# Patient Record
Sex: Female | Born: 1940
Health system: Southern US, Community
[De-identification: ages and names within clinical notes are randomized; demographics above are authoritative.]

## PROBLEM LIST (undated history)

## (undated) DIAGNOSIS — K219 Gastro-esophageal reflux disease without esophagitis: Secondary | ICD-10-CM

## (undated) DIAGNOSIS — C801 Malignant (primary) neoplasm, unspecified: Secondary | ICD-10-CM

## (undated) DIAGNOSIS — R002 Palpitations: Secondary | ICD-10-CM

## (undated) HISTORY — DX: Palpitations: R00.2

## (undated) HISTORY — PX: OTHER SURGICAL HISTORY: SHX169

## (undated) HISTORY — DX: Gastro-esophageal reflux disease without esophagitis: K21.9

## (undated) HISTORY — PX: BREAST BIOPSY: SHX20

---

## 1987-02-04 HISTORY — PX: VAGINAL HYSTERECTOMY: SUR661

## 1988-02-04 HISTORY — PX: CYSTECTOMY: SUR359

## 2003-11-28 ENCOUNTER — Ambulatory Visit: Payer: Self-pay | Admitting: Family Medicine

## 2004-11-28 ENCOUNTER — Ambulatory Visit: Payer: Self-pay | Admitting: Family Medicine

## 2005-10-29 ENCOUNTER — Ambulatory Visit: Payer: Self-pay | Admitting: Family Medicine

## 2005-12-02 ENCOUNTER — Ambulatory Visit: Payer: Self-pay | Admitting: Family Medicine

## 2005-12-03 ENCOUNTER — Ambulatory Visit: Payer: Self-pay | Admitting: Gastroenterology

## 2007-01-07 ENCOUNTER — Ambulatory Visit: Payer: Self-pay | Admitting: Family Medicine

## 2008-01-10 ENCOUNTER — Ambulatory Visit: Payer: Self-pay | Admitting: Family Medicine

## 2008-02-14 ENCOUNTER — Ambulatory Visit: Payer: Self-pay | Admitting: Unknown Physician Specialty

## 2009-01-16 ENCOUNTER — Ambulatory Visit: Payer: Self-pay | Admitting: Family Medicine

## 2009-12-20 ENCOUNTER — Ambulatory Visit: Payer: Self-pay | Admitting: Gastroenterology

## 2010-02-27 ENCOUNTER — Ambulatory Visit: Payer: Self-pay | Admitting: Internal Medicine

## 2010-03-11 ENCOUNTER — Ambulatory Visit: Payer: Self-pay | Admitting: Internal Medicine

## 2011-03-05 ENCOUNTER — Ambulatory Visit: Payer: Self-pay | Admitting: Internal Medicine

## 2011-04-07 ENCOUNTER — Ambulatory Visit: Payer: Self-pay | Admitting: Internal Medicine

## 2012-04-23 ENCOUNTER — Ambulatory Visit: Payer: Self-pay | Admitting: Internal Medicine

## 2012-08-28 ENCOUNTER — Other Ambulatory Visit: Payer: Self-pay | Admitting: Family

## 2013-05-04 ENCOUNTER — Ambulatory Visit: Payer: Self-pay | Admitting: Internal Medicine

## 2013-05-17 ENCOUNTER — Other Ambulatory Visit: Payer: Self-pay | Admitting: Otolaryngology

## 2013-05-17 DIAGNOSIS — R200 Anesthesia of skin: Secondary | ICD-10-CM

## 2013-05-26 ENCOUNTER — Ambulatory Visit
Admission: RE | Admit: 2013-05-26 | Discharge: 2013-05-26 | Disposition: A | Discharge status: Home / Self Care | Payer: Medicare HMO | Source: Ambulatory Visit | Attending: Otolaryngology | Admitting: Otolaryngology

## 2013-05-26 DIAGNOSIS — R200 Anesthesia of skin: Secondary | ICD-10-CM

## 2013-05-26 MED ORDER — GADOBENATE DIMEGLUMINE 529 MG/ML IV SOLN: 14.0000 mL | Freq: Once | INTRAVENOUS | Status: AC | PRN

## 2014-04-06 DIAGNOSIS — E782 Mixed hyperlipidemia: Secondary | ICD-10-CM | POA: Insufficient documentation

## 2014-04-24 DIAGNOSIS — M818 Other osteoporosis without current pathological fracture: Secondary | ICD-10-CM | POA: Insufficient documentation

## 2014-05-08 ENCOUNTER — Ambulatory Visit
Admit: 2014-05-08 | Disposition: A | Discharge status: Home / Self Care | Payer: Self-pay | Attending: Internal Medicine | Admitting: Internal Medicine

## 2014-05-11 ENCOUNTER — Ambulatory Visit
Admit: 2014-05-11 | Disposition: A | Discharge status: Home / Self Care | Payer: Self-pay | Attending: Internal Medicine | Admitting: Internal Medicine

## 2015-04-18 DIAGNOSIS — E559 Vitamin D deficiency, unspecified: Secondary | ICD-10-CM | POA: Diagnosis not present

## 2015-04-18 DIAGNOSIS — Z0000 Encounter for general adult medical examination without abnormal findings: Secondary | ICD-10-CM | POA: Diagnosis not present

## 2015-04-18 DIAGNOSIS — E782 Mixed hyperlipidemia: Secondary | ICD-10-CM | POA: Diagnosis not present

## 2015-04-25 DIAGNOSIS — M818 Other osteoporosis without current pathological fracture: Secondary | ICD-10-CM | POA: Diagnosis not present

## 2015-04-25 DIAGNOSIS — E039 Hypothyroidism, unspecified: Secondary | ICD-10-CM

## 2015-04-25 DIAGNOSIS — Z0000 Encounter for general adult medical examination without abnormal findings: Secondary | ICD-10-CM | POA: Diagnosis not present

## 2015-04-26 ENCOUNTER — Other Ambulatory Visit: Payer: Self-pay | Admitting: Internal Medicine

## 2015-04-26 DIAGNOSIS — Z1231 Encounter for screening mammogram for malignant neoplasm of breast: Secondary | ICD-10-CM

## 2015-05-08 DIAGNOSIS — M818 Other osteoporosis without current pathological fracture: Secondary | ICD-10-CM | POA: Diagnosis not present

## 2015-05-14 ENCOUNTER — Other Ambulatory Visit: Payer: Self-pay | Admitting: Internal Medicine

## 2015-05-14 ENCOUNTER — Ambulatory Visit
Admission: RE | Admit: 2015-05-14 | Discharge: 2015-05-14 | Disposition: A | Discharge status: Home / Self Care | Payer: PPO | Source: Ambulatory Visit | Attending: Internal Medicine | Admitting: Internal Medicine

## 2015-05-14 DIAGNOSIS — Z1231 Encounter for screening mammogram for malignant neoplasm of breast: Secondary | ICD-10-CM | POA: Insufficient documentation

## 2015-05-14 HISTORY — DX: Malignant (primary) neoplasm, unspecified: C80.1

## 2015-05-16 ENCOUNTER — Other Ambulatory Visit: Payer: Self-pay | Admitting: Internal Medicine

## 2015-05-16 DIAGNOSIS — R928 Other abnormal and inconclusive findings on diagnostic imaging of breast: Secondary | ICD-10-CM

## 2015-05-18 ENCOUNTER — Ambulatory Visit
Admission: RE | Admit: 2015-05-18 | Discharge: 2015-05-18 | Disposition: A | Discharge status: Home / Self Care | Payer: PPO | Source: Ambulatory Visit | Attending: Internal Medicine | Admitting: Internal Medicine

## 2015-05-18 DIAGNOSIS — R928 Other abnormal and inconclusive findings on diagnostic imaging of breast: Secondary | ICD-10-CM

## 2015-05-18 DIAGNOSIS — N63 Unspecified lump in breast: Secondary | ICD-10-CM | POA: Diagnosis not present

## 2015-05-22 DIAGNOSIS — B9789 Other viral agents as the cause of diseases classified elsewhere: Secondary | ICD-10-CM | POA: Diagnosis not present

## 2015-05-28 DIAGNOSIS — M5414 Radiculopathy, thoracic region: Secondary | ICD-10-CM | POA: Diagnosis not present

## 2015-05-28 DIAGNOSIS — J40 Bronchitis, not specified as acute or chronic: Secondary | ICD-10-CM | POA: Diagnosis not present

## 2015-06-27 DIAGNOSIS — E039 Hypothyroidism, unspecified: Secondary | ICD-10-CM | POA: Diagnosis not present

## 2015-07-11 DIAGNOSIS — H40013 Open angle with borderline findings, low risk, bilateral: Secondary | ICD-10-CM | POA: Diagnosis not present

## 2015-07-11 DIAGNOSIS — H26491 Other secondary cataract, right eye: Secondary | ICD-10-CM | POA: Diagnosis not present

## 2015-07-11 DIAGNOSIS — H31001 Unspecified chorioretinal scars, right eye: Secondary | ICD-10-CM | POA: Diagnosis not present

## 2015-07-11 DIAGNOSIS — H524 Presbyopia: Secondary | ICD-10-CM | POA: Diagnosis not present

## 2016-01-09 DIAGNOSIS — H40013 Open angle with borderline findings, low risk, bilateral: Secondary | ICD-10-CM | POA: Diagnosis not present

## 2016-04-02 DIAGNOSIS — N390 Urinary tract infection, site not specified: Secondary | ICD-10-CM | POA: Diagnosis not present

## 2016-04-25 DIAGNOSIS — E039 Hypothyroidism, unspecified: Secondary | ICD-10-CM | POA: Diagnosis not present

## 2016-04-25 DIAGNOSIS — Z0000 Encounter for general adult medical examination without abnormal findings: Secondary | ICD-10-CM | POA: Diagnosis not present

## 2016-05-07 DIAGNOSIS — Z0000 Encounter for general adult medical examination without abnormal findings: Secondary | ICD-10-CM | POA: Diagnosis not present

## 2016-05-07 DIAGNOSIS — M7050 Other bursitis of knee, unspecified knee: Secondary | ICD-10-CM | POA: Diagnosis not present

## 2016-05-08 ENCOUNTER — Other Ambulatory Visit: Payer: Self-pay | Admitting: Internal Medicine

## 2016-05-08 DIAGNOSIS — Z1231 Encounter for screening mammogram for malignant neoplasm of breast: Secondary | ICD-10-CM

## 2016-05-23 ENCOUNTER — Ambulatory Visit: Payer: PPO

## 2016-05-28 ENCOUNTER — Ambulatory Visit
Admission: RE | Admit: 2016-05-28 | Discharge: 2016-05-28 | Disposition: A | Discharge status: Home / Self Care | Payer: PPO | Source: Ambulatory Visit | Attending: Internal Medicine | Admitting: Internal Medicine

## 2016-05-28 DIAGNOSIS — Z1231 Encounter for screening mammogram for malignant neoplasm of breast: Secondary | ICD-10-CM | POA: Diagnosis not present

## 2016-10-08 DIAGNOSIS — K5792 Diverticulitis of intestine, part unspecified, without perforation or abscess without bleeding: Secondary | ICD-10-CM | POA: Diagnosis not present

## 2017-02-03 DIAGNOSIS — E079 Disorder of thyroid, unspecified: Secondary | ICD-10-CM

## 2017-02-03 HISTORY — DX: Disorder of thyroid, unspecified: E07.9

## 2017-02-04 DIAGNOSIS — H52223 Regular astigmatism, bilateral: Secondary | ICD-10-CM | POA: Diagnosis not present

## 2017-05-06 DIAGNOSIS — Z79899 Other long term (current) drug therapy: Secondary | ICD-10-CM | POA: Diagnosis not present

## 2017-05-06 DIAGNOSIS — E039 Hypothyroidism, unspecified: Secondary | ICD-10-CM | POA: Diagnosis not present

## 2017-05-06 DIAGNOSIS — Z0000 Encounter for general adult medical examination without abnormal findings: Secondary | ICD-10-CM | POA: Diagnosis not present

## 2017-05-13 DIAGNOSIS — Z0000 Encounter for general adult medical examination without abnormal findings: Secondary | ICD-10-CM | POA: Diagnosis not present

## 2017-05-13 DIAGNOSIS — L570 Actinic keratosis: Secondary | ICD-10-CM | POA: Diagnosis not present

## 2017-05-13 DIAGNOSIS — E039 Hypothyroidism, unspecified: Secondary | ICD-10-CM | POA: Diagnosis not present

## 2017-05-13 DIAGNOSIS — Z79899 Other long term (current) drug therapy: Secondary | ICD-10-CM | POA: Diagnosis not present

## 2017-05-13 DIAGNOSIS — E782 Mixed hyperlipidemia: Secondary | ICD-10-CM | POA: Diagnosis not present

## 2017-05-18 ENCOUNTER — Other Ambulatory Visit: Payer: Self-pay | Admitting: Internal Medicine

## 2017-05-18 DIAGNOSIS — Z1231 Encounter for screening mammogram for malignant neoplasm of breast: Secondary | ICD-10-CM

## 2017-06-05 ENCOUNTER — Ambulatory Visit
Admission: RE | Admit: 2017-06-05 | Discharge: 2017-06-05 | Disposition: A | Discharge status: Home / Self Care | Payer: PPO | Source: Ambulatory Visit | Attending: Internal Medicine | Admitting: Internal Medicine

## 2017-06-05 DIAGNOSIS — Z1231 Encounter for screening mammogram for malignant neoplasm of breast: Secondary | ICD-10-CM | POA: Insufficient documentation

## 2018-05-12 DIAGNOSIS — E039 Hypothyroidism, unspecified: Secondary | ICD-10-CM | POA: Diagnosis not present

## 2018-05-12 DIAGNOSIS — E782 Mixed hyperlipidemia: Secondary | ICD-10-CM | POA: Diagnosis not present

## 2018-05-12 DIAGNOSIS — Z79899 Other long term (current) drug therapy: Secondary | ICD-10-CM | POA: Diagnosis not present

## 2018-05-17 DIAGNOSIS — M818 Other osteoporosis without current pathological fracture: Secondary | ICD-10-CM | POA: Diagnosis not present

## 2018-05-17 DIAGNOSIS — L989 Disorder of the skin and subcutaneous tissue, unspecified: Secondary | ICD-10-CM | POA: Diagnosis not present

## 2018-05-17 DIAGNOSIS — E039 Hypothyroidism, unspecified: Secondary | ICD-10-CM | POA: Diagnosis not present

## 2018-05-17 DIAGNOSIS — Z0000 Encounter for general adult medical examination without abnormal findings: Secondary | ICD-10-CM | POA: Diagnosis not present

## 2018-05-17 DIAGNOSIS — E782 Mixed hyperlipidemia: Secondary | ICD-10-CM | POA: Diagnosis not present

## 2018-06-16 ENCOUNTER — Other Ambulatory Visit: Payer: Self-pay | Admitting: Internal Medicine

## 2018-06-16 DIAGNOSIS — Z1231 Encounter for screening mammogram for malignant neoplasm of breast: Secondary | ICD-10-CM

## 2018-07-01 ENCOUNTER — Other Ambulatory Visit: Payer: Self-pay

## 2018-07-01 ENCOUNTER — Ambulatory Visit
Admission: RE | Admit: 2018-07-01 | Discharge: 2018-07-01 | Disposition: A | Discharge status: Home / Self Care | Payer: PPO | Source: Ambulatory Visit | Attending: Internal Medicine | Admitting: Internal Medicine

## 2018-07-01 DIAGNOSIS — Z1231 Encounter for screening mammogram for malignant neoplasm of breast: Secondary | ICD-10-CM | POA: Insufficient documentation

## 2018-08-12 DIAGNOSIS — R05 Cough: Secondary | ICD-10-CM | POA: Diagnosis not present

## 2018-08-12 DIAGNOSIS — K219 Gastro-esophageal reflux disease without esophagitis: Secondary | ICD-10-CM | POA: Diagnosis not present

## 2018-08-12 DIAGNOSIS — J40 Bronchitis, not specified as acute or chronic: Secondary | ICD-10-CM | POA: Diagnosis not present

## 2018-09-02 DIAGNOSIS — R05 Cough: Secondary | ICD-10-CM | POA: Diagnosis not present

## 2018-09-02 DIAGNOSIS — J189 Pneumonia, unspecified organism: Secondary | ICD-10-CM | POA: Diagnosis not present

## 2018-09-02 DIAGNOSIS — R911 Solitary pulmonary nodule: Secondary | ICD-10-CM | POA: Diagnosis not present

## 2018-09-27 DIAGNOSIS — H61032 Chondritis of left external ear: Secondary | ICD-10-CM | POA: Diagnosis not present

## 2018-09-27 DIAGNOSIS — L538 Other specified erythematous conditions: Secondary | ICD-10-CM | POA: Diagnosis not present

## 2018-09-27 DIAGNOSIS — D485 Neoplasm of uncertain behavior of skin: Secondary | ICD-10-CM | POA: Diagnosis not present

## 2018-09-27 DIAGNOSIS — D2261 Melanocytic nevi of right upper limb, including shoulder: Secondary | ICD-10-CM | POA: Diagnosis not present

## 2018-09-27 DIAGNOSIS — D2271 Melanocytic nevi of right lower limb, including hip: Secondary | ICD-10-CM | POA: Diagnosis not present

## 2018-09-27 DIAGNOSIS — L820 Inflamed seborrheic keratosis: Secondary | ICD-10-CM | POA: Diagnosis not present

## 2018-09-27 DIAGNOSIS — D225 Melanocytic nevi of trunk: Secondary | ICD-10-CM | POA: Diagnosis not present

## 2018-09-27 DIAGNOSIS — D2262 Melanocytic nevi of left upper limb, including shoulder: Secondary | ICD-10-CM | POA: Diagnosis not present

## 2018-09-27 DIAGNOSIS — D2272 Melanocytic nevi of left lower limb, including hip: Secondary | ICD-10-CM | POA: Diagnosis not present

## 2018-09-27 DIAGNOSIS — D171 Benign lipomatous neoplasm of skin and subcutaneous tissue of trunk: Secondary | ICD-10-CM | POA: Diagnosis not present

## 2018-10-07 DIAGNOSIS — H401121 Primary open-angle glaucoma, left eye, mild stage: Secondary | ICD-10-CM | POA: Diagnosis not present

## 2018-11-09 DIAGNOSIS — H401121 Primary open-angle glaucoma, left eye, mild stage: Secondary | ICD-10-CM | POA: Diagnosis not present

## 2018-12-06 DIAGNOSIS — R911 Solitary pulmonary nodule: Secondary | ICD-10-CM | POA: Diagnosis not present

## 2019-03-23 DIAGNOSIS — H16143 Punctate keratitis, bilateral: Secondary | ICD-10-CM | POA: Diagnosis not present

## 2019-03-23 DIAGNOSIS — H26491 Other secondary cataract, right eye: Secondary | ICD-10-CM | POA: Diagnosis not present

## 2019-05-16 DIAGNOSIS — E782 Mixed hyperlipidemia: Secondary | ICD-10-CM | POA: Diagnosis not present

## 2019-05-16 DIAGNOSIS — M818 Other osteoporosis without current pathological fracture: Secondary | ICD-10-CM | POA: Diagnosis not present

## 2019-05-16 DIAGNOSIS — E039 Hypothyroidism, unspecified: Secondary | ICD-10-CM | POA: Diagnosis not present

## 2019-05-23 DIAGNOSIS — E039 Hypothyroidism, unspecified: Secondary | ICD-10-CM | POA: Diagnosis not present

## 2019-05-23 DIAGNOSIS — Z0000 Encounter for general adult medical examination without abnormal findings: Secondary | ICD-10-CM | POA: Diagnosis not present

## 2019-05-23 DIAGNOSIS — E782 Mixed hyperlipidemia: Secondary | ICD-10-CM | POA: Diagnosis not present

## 2019-05-24 ENCOUNTER — Other Ambulatory Visit: Payer: Self-pay | Admitting: Internal Medicine

## 2019-05-24 DIAGNOSIS — Z1231 Encounter for screening mammogram for malignant neoplasm of breast: Secondary | ICD-10-CM

## 2019-07-05 ENCOUNTER — Ambulatory Visit
Admission: RE | Admit: 2019-07-05 | Discharge: 2019-07-05 | Disposition: A | Discharge status: Home / Self Care | Payer: PPO | Source: Ambulatory Visit | Attending: Internal Medicine | Admitting: Internal Medicine

## 2019-07-05 DIAGNOSIS — Z1231 Encounter for screening mammogram for malignant neoplasm of breast: Secondary | ICD-10-CM | POA: Insufficient documentation

## 2019-09-05 DIAGNOSIS — H401121 Primary open-angle glaucoma, left eye, mild stage: Secondary | ICD-10-CM | POA: Diagnosis not present

## 2019-11-18 DIAGNOSIS — H40003 Preglaucoma, unspecified, bilateral: Secondary | ICD-10-CM | POA: Diagnosis not present

## 2019-12-08 DIAGNOSIS — H26491 Other secondary cataract, right eye: Secondary | ICD-10-CM | POA: Diagnosis not present

## 2020-01-09 DIAGNOSIS — H40003 Preglaucoma, unspecified, bilateral: Secondary | ICD-10-CM | POA: Diagnosis not present

## 2020-01-16 DIAGNOSIS — H401131 Primary open-angle glaucoma, bilateral, mild stage: Secondary | ICD-10-CM | POA: Diagnosis not present

## 2020-01-23 DIAGNOSIS — R0789 Other chest pain: Secondary | ICD-10-CM | POA: Diagnosis not present

## 2020-01-23 DIAGNOSIS — M5414 Radiculopathy, thoracic region: Secondary | ICD-10-CM | POA: Diagnosis not present

## 2020-03-19 DIAGNOSIS — H401131 Primary open-angle glaucoma, bilateral, mild stage: Secondary | ICD-10-CM | POA: Diagnosis not present

## 2020-05-17 DIAGNOSIS — E782 Mixed hyperlipidemia: Secondary | ICD-10-CM | POA: Diagnosis not present

## 2020-05-25 DIAGNOSIS — R0600 Dyspnea, unspecified: Secondary | ICD-10-CM | POA: Diagnosis not present

## 2020-05-25 DIAGNOSIS — Z0000 Encounter for general adult medical examination without abnormal findings: Secondary | ICD-10-CM | POA: Diagnosis not present

## 2020-05-25 DIAGNOSIS — J439 Emphysema, unspecified: Secondary | ICD-10-CM | POA: Diagnosis not present

## 2020-05-25 DIAGNOSIS — E782 Mixed hyperlipidemia: Secondary | ICD-10-CM | POA: Diagnosis not present

## 2020-05-25 DIAGNOSIS — I208 Other forms of angina pectoris: Secondary | ICD-10-CM | POA: Diagnosis not present

## 2020-05-31 ENCOUNTER — Other Ambulatory Visit: Payer: Self-pay | Admitting: Internal Medicine

## 2020-05-31 DIAGNOSIS — Z1231 Encounter for screening mammogram for malignant neoplasm of breast: Secondary | ICD-10-CM

## 2020-06-05 DIAGNOSIS — R0600 Dyspnea, unspecified: Secondary | ICD-10-CM | POA: Diagnosis not present

## 2020-06-26 DIAGNOSIS — J4530 Mild persistent asthma, uncomplicated: Secondary | ICD-10-CM | POA: Diagnosis not present

## 2020-06-26 DIAGNOSIS — R0600 Dyspnea, unspecified: Secondary | ICD-10-CM | POA: Diagnosis not present

## 2020-07-05 ENCOUNTER — Ambulatory Visit
Admission: RE | Admit: 2020-07-05 | Discharge: 2020-07-05 | Disposition: A | Discharge status: Home / Self Care | Payer: PPO | Source: Ambulatory Visit | Attending: Internal Medicine | Admitting: Internal Medicine

## 2020-07-05 ENCOUNTER — Other Ambulatory Visit: Payer: Self-pay

## 2020-07-05 DIAGNOSIS — Z1231 Encounter for screening mammogram for malignant neoplasm of breast: Secondary | ICD-10-CM | POA: Diagnosis not present

## 2020-09-18 DIAGNOSIS — H401131 Primary open-angle glaucoma, bilateral, mild stage: Secondary | ICD-10-CM | POA: Diagnosis not present

## 2021-01-11 DIAGNOSIS — B0223 Postherpetic polyneuropathy: Secondary | ICD-10-CM | POA: Diagnosis not present

## 2021-01-11 DIAGNOSIS — M5414 Radiculopathy, thoracic region: Secondary | ICD-10-CM | POA: Diagnosis not present

## 2021-02-15 DIAGNOSIS — B0223 Postherpetic polyneuropathy: Secondary | ICD-10-CM | POA: Insufficient documentation

## 2021-02-15 DIAGNOSIS — M5414 Radiculopathy, thoracic region: Secondary | ICD-10-CM | POA: Diagnosis not present

## 2021-03-18 DIAGNOSIS — H401131 Primary open-angle glaucoma, bilateral, mild stage: Secondary | ICD-10-CM | POA: Diagnosis not present

## 2021-03-25 DIAGNOSIS — H401122 Primary open-angle glaucoma, left eye, moderate stage: Secondary | ICD-10-CM | POA: Diagnosis not present

## 2021-04-11 ENCOUNTER — Other Ambulatory Visit: Payer: Self-pay | Admitting: Internal Medicine

## 2021-04-11 ENCOUNTER — Encounter: Payer: Self-pay | Admitting: Emergency Medicine

## 2021-04-11 ENCOUNTER — Other Ambulatory Visit: Payer: Self-pay

## 2021-04-11 ENCOUNTER — Inpatient Hospital Stay
Admission: EM | Admit: 2021-04-11 | Discharge: 2021-04-19 | DRG: 871 | Disposition: A | Discharge status: Home / Self Care | Payer: PPO | Attending: Internal Medicine | Admitting: Internal Medicine

## 2021-04-11 ENCOUNTER — Emergency Department: Payer: PPO

## 2021-04-11 ENCOUNTER — Ambulatory Visit
Admission: RE | Admit: 2021-04-11 | Discharge: 2021-04-11 | Disposition: A | Discharge status: Home / Self Care | Payer: PPO | Source: Ambulatory Visit | Attending: Internal Medicine | Admitting: Internal Medicine

## 2021-04-11 DIAGNOSIS — Z85828 Personal history of other malignant neoplasm of skin: Secondary | ICD-10-CM

## 2021-04-11 DIAGNOSIS — Z4682 Encounter for fitting and adjustment of non-vascular catheter: Secondary | ICD-10-CM | POA: Diagnosis not present

## 2021-04-11 DIAGNOSIS — K5730 Diverticulosis of large intestine without perforation or abscess without bleeding: Secondary | ICD-10-CM | POA: Diagnosis not present

## 2021-04-11 DIAGNOSIS — E872 Acidosis, unspecified: Secondary | ICD-10-CM | POA: Diagnosis present

## 2021-04-11 DIAGNOSIS — E871 Hypo-osmolality and hyponatremia: Secondary | ICD-10-CM | POA: Diagnosis present

## 2021-04-11 DIAGNOSIS — R634 Abnormal weight loss: Secondary | ICD-10-CM | POA: Diagnosis present

## 2021-04-11 DIAGNOSIS — Z20822 Contact with and (suspected) exposure to covid-19: Secondary | ICD-10-CM | POA: Diagnosis present

## 2021-04-11 DIAGNOSIS — D75838 Other thrombocytosis: Secondary | ICD-10-CM | POA: Diagnosis present

## 2021-04-11 DIAGNOSIS — R6520 Severe sepsis without septic shock: Secondary | ICD-10-CM | POA: Diagnosis present

## 2021-04-11 DIAGNOSIS — A419 Sepsis, unspecified organism: Secondary | ICD-10-CM | POA: Diagnosis present

## 2021-04-11 DIAGNOSIS — J90 Pleural effusion, not elsewhere classified: Secondary | ICD-10-CM | POA: Diagnosis not present

## 2021-04-11 DIAGNOSIS — E039 Hypothyroidism, unspecified: Secondary | ICD-10-CM | POA: Diagnosis present

## 2021-04-11 DIAGNOSIS — J154 Pneumonia due to other streptococci: Secondary | ICD-10-CM

## 2021-04-11 DIAGNOSIS — D72829 Elevated white blood cell count, unspecified: Secondary | ICD-10-CM

## 2021-04-11 DIAGNOSIS — I480 Paroxysmal atrial fibrillation: Secondary | ICD-10-CM

## 2021-04-11 DIAGNOSIS — Z79899 Other long term (current) drug therapy: Secondary | ICD-10-CM

## 2021-04-11 DIAGNOSIS — K810 Acute cholecystitis: Secondary | ICD-10-CM

## 2021-04-11 DIAGNOSIS — K219 Gastro-esophageal reflux disease without esophagitis: Secondary | ICD-10-CM | POA: Diagnosis present

## 2021-04-11 DIAGNOSIS — J188 Other pneumonia, unspecified organism: Secondary | ICD-10-CM | POA: Diagnosis not present

## 2021-04-11 DIAGNOSIS — J189 Pneumonia, unspecified organism: Secondary | ICD-10-CM

## 2021-04-11 DIAGNOSIS — I4891 Unspecified atrial fibrillation: Secondary | ICD-10-CM | POA: Diagnosis not present

## 2021-04-11 DIAGNOSIS — J9811 Atelectasis: Secondary | ICD-10-CM | POA: Diagnosis not present

## 2021-04-11 DIAGNOSIS — I5033 Acute on chronic diastolic (congestive) heart failure: Secondary | ICD-10-CM

## 2021-04-11 DIAGNOSIS — Z9889 Other specified postprocedural states: Secondary | ICD-10-CM

## 2021-04-11 DIAGNOSIS — R531 Weakness: Principal | ICD-10-CM

## 2021-04-11 DIAGNOSIS — Z79890 Hormone replacement therapy (postmenopausal): Secondary | ICD-10-CM | POA: Diagnosis not present

## 2021-04-11 DIAGNOSIS — J159 Unspecified bacterial pneumonia: Secondary | ICD-10-CM | POA: Diagnosis not present

## 2021-04-11 DIAGNOSIS — Z6821 Body mass index (BMI) 21.0-21.9, adult: Secondary | ICD-10-CM

## 2021-04-11 DIAGNOSIS — D509 Iron deficiency anemia, unspecified: Secondary | ICD-10-CM

## 2021-04-11 DIAGNOSIS — I959 Hypotension, unspecified: Secondary | ICD-10-CM | POA: Diagnosis present

## 2021-04-11 DIAGNOSIS — R590 Localized enlarged lymph nodes: Secondary | ICD-10-CM | POA: Diagnosis not present

## 2021-04-11 DIAGNOSIS — J869 Pyothorax without fistula: Secondary | ICD-10-CM | POA: Diagnosis present

## 2021-04-11 DIAGNOSIS — D72823 Leukemoid reaction: Secondary | ICD-10-CM | POA: Diagnosis present

## 2021-04-11 DIAGNOSIS — R1011 Right upper quadrant pain: Secondary | ICD-10-CM

## 2021-04-11 DIAGNOSIS — J918 Pleural effusion in other conditions classified elsewhere: Secondary | ICD-10-CM

## 2021-04-11 DIAGNOSIS — Z938 Other artificial opening status: Secondary | ICD-10-CM

## 2021-04-11 DIAGNOSIS — J939 Pneumothorax, unspecified: Secondary | ICD-10-CM | POA: Diagnosis not present

## 2021-04-11 DIAGNOSIS — J9383 Other pneumothorax: Secondary | ICD-10-CM | POA: Diagnosis not present

## 2021-04-11 DIAGNOSIS — I700 Atherosclerosis of aorta: Secondary | ICD-10-CM | POA: Diagnosis not present

## 2021-04-11 DIAGNOSIS — I5021 Acute systolic (congestive) heart failure: Secondary | ICD-10-CM | POA: Diagnosis not present

## 2021-04-11 DIAGNOSIS — J984 Other disorders of lung: Secondary | ICD-10-CM | POA: Diagnosis not present

## 2021-04-11 LAB — PROCALCITONIN: Procalcitonin: 1.8 ng/mL

## 2021-04-11 LAB — URINALYSIS, ROUTINE W REFLEX MICROSCOPIC
Bacteria, UA: NONE SEEN
Bilirubin Urine: NEGATIVE
Glucose, UA: NEGATIVE mg/dL
Ketones, ur: NEGATIVE mg/dL
Nitrite: NEGATIVE
Protein, ur: NEGATIVE mg/dL
Specific Gravity, Urine: 1.033 — ABNORMAL HIGH (ref 1.005–1.030)
Squamous Epithelial / HPF: NONE SEEN (ref 0–5)
pH: 6 (ref 5.0–8.0)

## 2021-04-11 LAB — LACTIC ACID, PLASMA: Lactic Acid, Venous: 2.7 mmol/L (ref 0.5–1.9)

## 2021-04-11 LAB — CBC
HCT: 33.5 % — ABNORMAL LOW (ref 36.0–46.0)
Hemoglobin: 11 g/dL — ABNORMAL LOW (ref 12.0–15.0)
MCH: 28.3 pg (ref 26.0–34.0)
MCHC: 32.8 g/dL (ref 30.0–36.0)
MCV: 86.1 fL (ref 80.0–100.0)
Platelets: 454 10*3/uL — ABNORMAL HIGH (ref 150–400)
RBC: 3.89 MIL/uL (ref 3.87–5.11)
RDW: 13.5 % (ref 11.5–15.5)
WBC: 72.2 10*3/uL (ref 4.0–10.5)
nRBC: 0 % (ref 0.0–0.2)

## 2021-04-11 LAB — CBC WITH DIFFERENTIAL/PLATELET
Abs Immature Granulocytes: 4.69 10*3/uL — ABNORMAL HIGH (ref 0.00–0.07)
Basophils Absolute: 0 10*3/uL (ref 0.0–0.1)
Basophils Relative: 0 %
Eosinophils Absolute: 0.1 10*3/uL (ref 0.0–0.5)
Eosinophils Relative: 0 %
HCT: 33.7 % — ABNORMAL LOW (ref 36.0–46.0)
Hemoglobin: 11.1 g/dL — ABNORMAL LOW (ref 12.0–15.0)
Immature Granulocytes: 7 %
Lymphocytes Relative: 3 %
Lymphs Abs: 2.5 10*3/uL (ref 0.7–4.0)
MCH: 28.5 pg (ref 26.0–34.0)
MCHC: 32.9 g/dL (ref 30.0–36.0)
MCV: 86.6 fL (ref 80.0–100.0)
Monocytes Absolute: 1.9 10*3/uL — ABNORMAL HIGH (ref 0.1–1.0)
Monocytes Relative: 3 %
Neutro Abs: 63.3 10*3/uL — ABNORMAL HIGH (ref 1.7–7.7)
Neutrophils Relative %: 87 %
Platelets: 490 10*3/uL — ABNORMAL HIGH (ref 150–400)
RBC: 3.89 MIL/uL (ref 3.87–5.11)
RDW: 13.4 % (ref 11.5–15.5)
Smear Review: NORMAL
WBC: 72.5 10*3/uL (ref 4.0–10.5)
nRBC: 0 % (ref 0.0–0.2)

## 2021-04-11 LAB — BASIC METABOLIC PANEL
Anion gap: 12 (ref 5–15)
BUN: 19 mg/dL (ref 8–23)
CO2: 24 mmol/L (ref 22–32)
Calcium: 8.3 mg/dL — ABNORMAL LOW (ref 8.9–10.3)
Chloride: 87 mmol/L — ABNORMAL LOW (ref 98–111)
Creatinine, Ser: 0.91 mg/dL (ref 0.44–1.00)
GFR, Estimated: >60 mL/min (ref >60)
Glucose, Bld: 88 mg/dL (ref 70–99)
Potassium: 4 mmol/L (ref 3.5–5.1)
Sodium: 123 mmol/L — ABNORMAL LOW (ref 135–145)

## 2021-04-11 LAB — RESP PANEL BY RT-PCR (FLU A&B, COVID) ARPGX2
Influenza A by PCR: NEGATIVE
Influenza B by PCR: NEGATIVE
SARS Coronavirus 2 by RT PCR: NEGATIVE

## 2021-04-11 LAB — OSMOLALITY, URINE: Osmolality, Ur: 334 mOsm/kg (ref 300–900)

## 2021-04-11 LAB — SODIUM, URINE, RANDOM: Sodium, Ur: <10 mmol/L

## 2021-04-11 LAB — TSH: TSH: 2.955 u[IU]/mL (ref 0.350–4.500)

## 2021-04-11 LAB — POCT I-STAT CREATININE: Creatinine, Ser: 0.9 mg/dL (ref 0.44–1.00)

## 2021-04-11 MED ORDER — VITAMIN B-12 1000 MCG PO TABS
1000.0000 ug | ORAL_TABLET | Freq: Every day | ORAL | Status: DC
  Administered 2021-04-11 – 2021-04-19 (×8): 1000 ug via ORAL
  Filled 2021-04-11 (×10): qty 1

## 2021-04-11 MED ORDER — METRONIDAZOLE 500 MG/100ML IV SOLN
500.0000 mg | Freq: Two times a day (BID) | INTRAVENOUS | Status: DC
  Administered 2021-04-12: 500 mg via INTRAVENOUS
  Filled 2021-04-11 (×2): qty 100

## 2021-04-11 MED ORDER — KETOROLAC TROMETHAMINE 30 MG/ML IJ SOLN
15.0000 mg | Freq: Once | INTRAMUSCULAR | Status: AC
  Administered 2021-04-11: 18:00:00 15 mg via INTRAVENOUS
  Filled 2021-04-11: qty 1

## 2021-04-11 MED ORDER — ENOXAPARIN SODIUM 40 MG/0.4ML IJ SOSY
40.0000 mg | PREFILLED_SYRINGE | INTRAMUSCULAR | Status: DC
  Administered 2021-04-11: 22:00:00 40 mg via SUBCUTANEOUS
  Filled 2021-04-11: qty 0.4

## 2021-04-11 MED ORDER — SODIUM CHLORIDE 0.9 % IV BOLUS
1000.0000 mL | Freq: Once | INTRAVENOUS | Status: AC
  Administered 2021-04-11: 16:00:00 1000 mL via INTRAVENOUS

## 2021-04-11 MED ORDER — VANCOMYCIN HCL 1500 MG/300ML IV SOLN
1500.0000 mg | Freq: Once | INTRAVENOUS | Status: AC
  Administered 2021-04-12: 1500 mg via INTRAVENOUS
  Filled 2021-04-11: qty 300

## 2021-04-11 MED ORDER — DILTIAZEM HCL-DEXTROSE 125-5 MG/125ML-% IV SOLN (PREMIX)
5.0000 mg/h | INTRAVENOUS | Status: DC
  Administered 2021-04-11: 17:00:00 5 mg/h via INTRAVENOUS
  Filled 2021-04-11: qty 125

## 2021-04-11 MED ORDER — LACTATED RINGERS IV SOLN: INTRAVENOUS | Status: DC

## 2021-04-11 MED ORDER — SODIUM CHLORIDE 0.9 % IV SOLN
2.0000 g | Freq: Two times a day (BID) | INTRAVENOUS | Status: DC
  Administered 2021-04-11: 22:00:00 2 g via INTRAVENOUS
  Filled 2021-04-11 (×3): qty 2

## 2021-04-11 MED ORDER — LACTATED RINGERS IV BOLUS (SEPSIS)
1000.0000 mL | Freq: Once | INTRAVENOUS | Status: AC
  Administered 2021-04-11: 1000 mL via INTRAVENOUS

## 2021-04-11 MED ORDER — SODIUM CHLORIDE 0.9 % IV SOLN: INTRAVENOUS | Status: DC

## 2021-04-11 MED ORDER — ACETAMINOPHEN 325 MG PO TABS
650.0000 mg | ORAL_TABLET | Freq: Four times a day (QID) | ORAL | Status: DC | PRN
  Administered 2021-04-18 – 2021-04-19 (×2): 650 mg via ORAL
  Filled 2021-04-11 (×2): qty 2

## 2021-04-11 MED ORDER — PANTOPRAZOLE SODIUM 40 MG PO TBEC
40.0000 mg | DELAYED_RELEASE_TABLET | Freq: Every day | ORAL | Status: DC
  Administered 2021-04-12 – 2021-04-19 (×8): 40 mg via ORAL
  Filled 2021-04-11 (×8): qty 1

## 2021-04-11 MED ORDER — ACETAMINOPHEN 650 MG RE SUPP: 650.0000 mg | Freq: Four times a day (QID) | RECTAL | Status: DC | PRN

## 2021-04-11 MED ORDER — AMIODARONE LOAD VIA INFUSION
150.0000 mg | Freq: Once | INTRAVENOUS | Status: AC
  Administered 2021-04-11: 18:00:00 150 mg via INTRAVENOUS
  Filled 2021-04-11: qty 83.34

## 2021-04-11 MED ORDER — AMIODARONE HCL IN DEXTROSE 360-4.14 MG/200ML-% IV SOLN
60.0000 mg/h | INTRAVENOUS | Status: AC
  Administered 2021-04-11 (×2): 60 mg/h via INTRAVENOUS
  Filled 2021-04-11 (×2): qty 200

## 2021-04-11 MED ORDER — ONDANSETRON HCL 4 MG/2ML IJ SOLN: 4.0000 mg | Freq: Four times a day (QID) | INTRAMUSCULAR | Status: DC | PRN

## 2021-04-11 MED ORDER — ONDANSETRON HCL 4 MG PO TABS: 4.0000 mg | ORAL_TABLET | Freq: Four times a day (QID) | ORAL | Status: DC | PRN

## 2021-04-11 MED ORDER — METOPROLOL TARTRATE 5 MG/5ML IV SOLN
5.0000 mg | Freq: Once | INTRAVENOUS | Status: AC
  Administered 2021-04-11: 16:00:00 5 mg via INTRAVENOUS
  Filled 2021-04-11: qty 5

## 2021-04-11 MED ORDER — VANCOMYCIN HCL 750 MG/150ML IV SOLN
750.0000 mg | INTRAVENOUS | Status: DC
  Filled 2021-04-11 (×2): qty 150

## 2021-04-11 MED ORDER — LATANOPROST 0.005 % OP SOLN
1.0000 [drp] | Freq: Every day | OPHTHALMIC | Status: DC
  Administered 2021-04-11 – 2021-04-18 (×8): 1 [drp] via OPHTHALMIC
  Filled 2021-04-11 (×2): qty 2.5

## 2021-04-11 MED ORDER — ADULT MULTIVITAMIN W/MINERALS CH
1.0000 | ORAL_TABLET | Freq: Every day | ORAL | Status: DC
  Administered 2021-04-11 – 2021-04-19 (×8): 1 via ORAL
  Filled 2021-04-11 (×9): qty 1

## 2021-04-11 MED ORDER — LEVOTHYROXINE SODIUM 50 MCG PO TABS
75.0000 ug | ORAL_TABLET | Freq: Every day | ORAL | Status: DC
  Administered 2021-04-12 – 2021-04-19 (×8): 75 ug via ORAL
  Filled 2021-04-11 (×2): qty 1
  Filled 2021-04-11: qty 2
  Filled 2021-04-11: qty 1
  Filled 2021-04-11: qty 2
  Filled 2021-04-11 (×4): qty 1

## 2021-04-11 MED ORDER — AMIODARONE HCL IN DEXTROSE 360-4.14 MG/200ML-% IV SOLN
30.0000 mg/h | INTRAVENOUS | Status: DC
  Administered 2021-04-12: 30 mg/h via INTRAVENOUS
  Filled 2021-04-11: qty 200

## 2021-04-11 MED ORDER — IOHEXOL 300 MG/ML  SOLN
100.0000 mL | Freq: Once | INTRAMUSCULAR | Status: AC | PRN
  Administered 2021-04-11: 13:00:00 100 mL via INTRAVENOUS

## 2021-04-11 NOTE — Assessment & Plan Note (Signed)
-   With elevated immature granulocytes ?- Pathology to review smear has been ordered ?- Hematologist/oncologist, Dr. Grayland Ormond has been consulted and will see the patient ?- CBC in the a.m. ?

## 2021-04-11 NOTE — ED Notes (Signed)
MD Bradler at bedside speaking with pt at this time.  ?

## 2021-04-11 NOTE — Assessment & Plan Note (Addendum)
-   Continue diltiazem gtt. ?- Check TSH ?- IR consulted for thoracentesis ?- Complete echo ordered ?- [facility] cardiology, Dr. Curt Bears has been consulted by EDP, and he states he will see Katherine Bradshaw regarding atrial fibrillation ?

## 2021-04-11 NOTE — H&P (Addendum)
Addendum: Stat lactic acid, procalcitonin ordered on admission resulted.  Lactic acid was elevated 2.7, procalcitonin was elevated at 1.80  # Patient met severe sepsis criteria with increased heart rate, leukocytosis, source of loculated pneumonia, organ involvement is heart with new onset A-fib - Discussed with RN to hang LR 1 L bolus, patient with then received 2 L which is appropriate for her sepsis bolus - LR 125 mL/h ordered - Discussed with nursing staff to start additional IV if needed - Blood cultures x2 ordered - Continue broad-spectrum antibiotic as above   History and Physical   ITZIA CUNLIFFE PIR:[id number] DOB: Jun 19, 1940 DOA: 04/11/2021  PCP: Rusty Aus, MD  [facility] from: [facility]  I have personally briefly reviewed patient's old medical records in [place].  Chief Concern: Weakness  HPI: [name] is a 81 year old female with medical history of hypothyroid, GERD, who presents to the emergency department from [facility] clinic for chief concerns of shortness of breath, right lower quadrant pain, weakness.  Initial vitals in the emergency department showed temperature of 97.8, respiration rate of 20, heart rate of 102, initial blood pressure 130/96 and decreased to 102/42, SPO2 of 98% on room air, serum sodium 123, potassium 4.0, chloride 87, bicarb 24, BUN of 19, serum creatinine of 0.91, GFR greater than 60, nonfasting blood glucose 88, WBC 72.2, hemoglobin 11.1, platelets of 454.  CT abdomen pelvis with contrast at [facility] clinic was read as: Partially imaged loculated appearing small pleural effusion.  No acute abnormality in the abdomen or pelvis.  ED provider ordered portable chest x-ray and is currently pending.  Patient was found to have new onset atrial fibrillation with RVR.  ED treatment: Metoprolol 5 mg IV, amiodarone 150 mg IV, gtt., diltiazem gtt., ketorolac 15 mg IV one-time dose,   Patient did not respond to amiodarone gtt.  and was initiated on diltiazem gtt.  Patient also received sodium chloride 1 L bolus.  EDP also consulted Dr. Grayland Ormond, and Dr. Curt Bears with [facility] cardiology. ----------- At bedside she was able to tell me her name, age, current calendar year.  She reports that she experienced upper respiratory symptoms about 1 week ago. She had cough, that was productive of white phlegm. She denies fever, chills, known sick contacts.  She denies dysuria, diarrhea, syncope.  She had sharp intermittent right lower quadrant abdominal pain, that started 4 days, while she was sitting on the couch watching TV, 10/10, lasting seconds. It was initially constant. She denies trauma to her person.   She endorses an unintentional three pound weight loss in the last 4 days.  Social history: She lives by herself. She denies tobacco, etoh, recreational drug use. She is retired and formerly worked for an [employer].   Family history: sister had breast cancer   Vaccination history: She is vaccinated for covid 19 and influenza  ROS: Constitutional: no weight change, no fever ENT/Mouth: no sore throat, no rhinorrhea Eyes: no eye pain, no vision changes Cardiovascular: no chest pain, no dyspnea,  no edema, no palpitations Respiratory: no cough, no sputum, no wheezing Gastrointestinal: no nausea, no vomiting, no diarrhea, no constipation Genitourinary: no urinary incontinence, no dysuria, no hematuria Musculoskeletal: no arthralgias, no myalgias Skin: no skin lesions, no pruritus, Neuro: + weakness, no loss of consciousness, no syncope Psych: no anxiety, no depression, + decrease appetite Heme/Lymph: no bruising, no bleeding  ED Course: With emergency medicine provider, patient requiring hospitalization for chief concerns of new onset atrial  fibrillation.  Assessment/Plan  Principal Problem:   New onset atrial fibrillation (HCC) Active Problems:   Pleural effusion on right   Hyponatremia   Loculated  pleural effusion   Leukocytosis   Hypothyroidism   Sepsis due to pneumonia [facility])   Cardiovascular and Mediastinum * New onset atrial fibrillation (HCC) Assessment & Plan - Continue diltiazem gtt. - Check TSH - IR consulted for thoracentesis - Complete echo ordered [facility] cardiology, Dr. Curt Bears has been consulted by EDP, and he states he will see [name]  Respiratory Loculated pleural effusion Assessment & Plan - IR has been consulted for thoracentesis of right pleural effusion, labs ordered including flow cytometry, body fluid stain and culture, lactic dehydrogenase - Check MRSA PCR - Broad-spectrum antibiotic with cefepime per pharmacy, vancomycin per pharmacy, and metronidazole 500 g every 12 hours - Check lactic acid stat, disscused with RN the need for stat labs - 1 L bolus of LR ordered - PT, PTT, INR in the a.m. ordered  Pleural effusion on right Assessment & Plan - IR as above  Other Leukocytosis Assessment & Plan - With elevated immature granulocytes - Pathology to review smear has been ordered - Hematologist/oncologist, Dr. Grayland Ormond has been consulted and will see the patient - CBC in the a.m.  Hyponatremia Assessment & Plan - Etiology work-up in progress at this time - Moderate asymptomatic - Serum osmolality, urine osmolality, urine sodium ordered - Status post sodium chloride 1 L bolus per EDP - BMP in the a.m.  Hypothyroid-resumed home levothyroxine 75 mcg in the morning GERD-PPI  Chart reviewed.   DVT prophylaxis: Enoxaparin Code Status: Full code Diet: Heart healthy Family Communication: Updated son and son's girlfriend with patient's permission Disposition Plan: Pending clinical course Consults called: Cardiology, hematology/oncology Admission status: Progressive cardiac, observation  Past Medical History:  Diagnosis Date   Cancer ([place])    skin   Past Surgical History:  Procedure Laterality Date   BREAST  BIOPSY Bilateral    neg   Social History:  reports that she has never smoked. She has never used smokeless tobacco. She reports that she does not currently use alcohol. She reports that she does not use drugs.  No Known Allergies Family History  Problem Relation Age of Onset   Breast cancer Sister 92   Family history: Family history reviewed and not pertinent.  Prior to Admission medications   Medication Sig Start Date End Date Taking? Authorizing Provider  latanoprost (XALATAN) 0.005 % ophthalmic solution Place 1 drop into both eyes at bedtime.   Yes [provider]  levothyroxine (SYNTHROID) 75 MCG tablet Take 75 mcg by mouth every morning.   Yes [provider]  Multiple Vitamins-Minerals (MULTIVITAMIN WITH MINERALS) tablet Take 1 tablet by mouth daily.   Yes [provider]  pantoprazole (PROTONIX) 40 MG tablet Take 40 mg by mouth daily.   Yes [provider]  vitamin B-12 (CYANOCOBALAMIN) 1000 MCG tablet Take 1,000 mcg by mouth daily.   Yes [provider]   Physical Exam: Vitals:   04/11/21 1745 04/11/21 1757 04/11/21 2000 04/11/21 2230  BP: (!) 76/68 (!) 88/69 (!) 115/57 116/60  Pulse:  (!) 120 (!) 129 (!) 143  Resp:  20 (!) 26 (!) 31  Temp:      TempSrc:      SpO2:  95% 95% 93%  Weight:      Height:       Constitutional: appears age-appropriate, NAD, calm, comfortable Eyes: PERRL, lids  and conjunctivae normal ENMT: Mucous membranes are moist. Posterior pharynx clear of any exudate or lesions. Age-appropriate dentition. Hearing appropriate Neck: normal, supple, no masses, no thyromegaly Respiratory: clear to auscultation bilaterally, no wheezing, no crackles. Normal respiratory effort. No accessory muscle use.  Cardiovascular: Regular rate and rhythm, no murmurs / rubs / gallops. No extremity edema. 2+ pedal pulses. No carotid bruits.  Abdomen: no tenderness, no masses palpated, no hepatosplenomegaly. Bowel sounds positive.   Musculoskeletal: no clubbing / cyanosis. No joint deformity upper and lower extremities. Good ROM, no contractures, no atrophy. Normal muscle tone.  Skin: no rashes, lesions, ulcers. No induration Neurologic: Sensation intact. Strength 5/5 in all 4.  Psychiatric: Normal judgment and insight. Alert and oriented x 3. Normal mood.   EKG: independently reviewed last ED EKG showing atrial fibrillation with rate of 118, QTc 483.  Chest x-ray on Admission: I personally reviewed and I agreewith radiologist reading as below.  CT ABDOMEN PELVIS W CONTRAST  Result Date: 04/11/2021 CLINICAL DATA:  Right-sided abdominal pain EXAM: CT ABDOMEN AND PELVIS WITH CONTRAST TECHNIQUE: Multidetector CT imaging of the abdomen and pelvis was performed using the standard protocol following bolus administration of intravenous contrast. RADIATION DOSE REDUCTION: This exam was performed according to the departmental dose-optimization program which includes automated exposure control, adjustment of the mA and/or kV according to patient size and/or use of iterative reconstruction technique. CONTRAST:  113m OMNIPAQUE IOHEXOL 300 MG/ML  SOLN COMPARISON:  None. FINDINGS: Lower chest: Loculated appearing small right pleural effusion is partially imaged. There is adjacent atelectasis. Hepatobiliary: No focal liver abnormality is seen. No gallstones, gallbladder wall thickening, or biliary dilatation. Pancreas: Unremarkable. No pancreatic ductal dilatation or surrounding inflammatory changes. Spleen: Normal in size without focal abnormality. Adrenals/Urinary Tract: Adrenals are unremarkable. Too small to characterize low-density lesions of the right kidney. Bladder is unremarkable. Stomach/Bowel: Stomach is within normal limits. Bowel is normal in caliber. Sigmoid diverticulosis. Vascular/Lymphatic: Atherosclerosis.  No enlarged nodes. Reproductive: Status post hysterectomy. No adnexal masses. Other: No free fluid.  No acute abnormality  of the abdominal wall. Musculoskeletal: No acute osseous abnormality. IMPRESSION: Partially imaged loculated appearing small right pleural effusion. No acute abnormality in the abdomen or pelvis. Electronically Signed   By: PMacy MisM.D.   On: 04/11/2021 13:05   DG Chest Port 1 View  Result Date: 04/11/2021 CLINICAL DATA:  Evaluate pleural effusion seen on CT. EXAM: PORTABLE CHEST 1 VIEW COMPARISON:  None. FINDINGS: Mild to moderate severity diffuse, chronic appearing increased interstitial lung markings are seen. Mild areas of atelectasis are noted within the bilateral lung bases. There are very small bilateral pleural effusions. No pneumothorax is identified. The heart size and mediastinal contours are within normal limits. There is marked severity calcification of the aortic arch. The visualized skeletal structures are unremarkable. IMPRESSION: 1. Chronic appearing increased interstitial lung markings. Mild, superimposed component of interstitial edema cannot be excluded. 2. Mild bibasilar atelectasis. 3. Very small bilateral pleural effusions. Electronically Signed   By: TVirgina NorfolkM.D.   On: 04/11/2021 18:59    Labs on Admission: I have personally reviewed following labs  CBC: Recent Labs  Lab 04/11/21 1505  WBC 72.5*   72.2*  NEUTROABS 63.3*  HGB 11.1*   11.0*  HCT 33.7*   33.5*  MCV 86.6   86.1  PLT 490*   4672   Basic Metabolic Panel: Recent Labs  Lab 04/11/21 1252 04/11/21 1505  NA  --  123*  K  --  4.0  CL  --  87*  CO2  --  24  GLUCOSE  --  88  BUN  --  19  CREATININE 0.90 0.91  CALCIUM  --  8.3*   GFR: Estimated Creatinine Clearance: 42.9 mL/min (by C-G formula based on SCr of 0.91 mg/dL).  CRITICAL CARE Performed by: Criss Katherine  Total critical care time: 35 minutes  Critical care time was exclusive of separately billable procedures and treating other patients.  Critical care was necessary to treat or prevent imminent or life-threatening  deterioration.  Critical care was time spent personally by me on the following activities: development of treatment plan with patient and/or surrogate as well as nursing, discussions with consultants, evaluation of patient's response to treatment, examination of patient, obtaining history from patient or surrogate, ordering and performing treatments and interventions, ordering and review of laboratory studies, ordering and review of radiographic studies, pulse oximetry and re-evaluation of patient's condition.  Dr. Tobie Poet [facility]  If 7PM-7AM, please contact overnight-coverage provider If 7AM-7PM, please contact day coverage provider [email or web address]  04/11/2021, 11:36 PM

## 2021-04-11 NOTE — ED Provider Notes (Signed)
[facility] Provider Note   Event Date/Time   First MD Initiated Contact with Patient 04/11/21 1551     (approximate) History  Weakness  HPI Katherine Bradshaw is a 81 y.o. female with no stated past medical history presents for [facility] clinic after she presented for weakness, shortness of breath, and right lower quadrant abdominal pain that has been intermittent over the last 2 days.  Patient states that she had a CT done today that was normal but showed a possible right pleural effusion.  Patient also had evidence of hyponatremia to 128 as well as significant cytosis associated with 78.5k.  Patient describes this right lower quadrant abdominal pain as sharp, radiating from the inside out, and coming intermittently before waning spontaneously.  Patient denies any chest pain, palpitations, nausea/vomiting/diarrhea, or weakness/numbness/paresthesias in any extremity Physical Exam  Triage Vital Signs: ED Triage Vitals  Enc Vitals Group     BP 04/11/21 1457 (!) 130/96     Pulse Rate 04/11/21 1457 (!) 102     Resp 04/11/21 1457 20     Temp 04/11/21 1457 97.8 F (36.6 C)     Temp Source 04/11/21 1457 Oral     SpO2 04/11/21 1457 98 %     Weight 04/11/21 1454 126 lb (57.2 kg)     Height 04/11/21 1454 '5\' 5"'$  (1.651 m)     Head Circumference --      Peak Flow --      Pain Score 04/11/21 1454 10     Pain Loc --      Pain Edu? --      Excl. in [place]? --    Most recent vital signs: Vitals:   04/11/21 1745 04/11/21 1757  BP: (!) 76/68 (!) 88/69  Pulse:  (!) 120  Resp:  20  Temp:    SpO2:  95%   General: Awake, oriented x4. CV:  Good peripheral perfusion.  Resp:  Normal effort.  Irregularly irregular tachycardic pulse Abd:  No distention.  Other:  Elderly Caucasian female laying in bed in no distress ED Results / Procedures / Treatments  Labs (all labs ordered are listed, but only abnormal results are displayed) Labs Reviewed  BASIC METABOLIC PANEL - Abnormal;  Notable for the following components:      Result Value   Sodium 123 (*)    Chloride 87 (*)    Calcium 8.3 (*)    All other components within normal limits  CBC - Abnormal; Notable for the following components:   WBC 72.2 (*)    Hemoglobin 11.0 (*)    HCT 33.5 (*)    Platelets 454 (*)    All other components within normal limits  CBC WITH DIFFERENTIAL/PLATELET - Abnormal; Notable for the following components:   WBC 72.5 (*)    Hemoglobin 11.1 (*)    HCT 33.7 (*)    Platelets 490 (*)    Neutro Abs 63.3 (*)    Monocytes Absolute 1.9 (*)    Abs Immature Granulocytes 4.69 (*)    All other components within normal limits  RESP PANEL BY RT-PCR (FLU A&B, COVID) ARPGX2  URINALYSIS, ROUTINE W REFLEX MICROSCOPIC  PATHOLOGIST SMEAR REVIEW  BASIC METABOLIC PANEL  MAGNESIUM  PHOSPHORUS  CBC WITH DIFFERENTIAL/PLATELET  PROTIME-INR  CORTISOL-AM, BLOOD  PROCALCITONIN  TSH  LACTIC ACID, PLASMA  LACTIC ACID, PLASMA  CBG MONITORING, ED   EKG ED ECG REPORT I, Naaman Plummer, the attending physician, personally viewed and interpreted this  ECG. Date: 04/11/2021 EKG Time: 1753 Rate: 118 Rhythm: Atrial fibrillation with rapid ventricular response QRS Axis: normal Intervals: normal ST/T Wave abnormalities: normal Narrative Interpretation: Atrial fibrillation with rapid ventricular response.  No evidence of acute ischemia RADIOLOGY ED MD interpretation: CT of the abdomen and pelvis with IV contrast interpreted by me to show evidence of partially loculated appearing small right pleural effusion with no other acute abnormalities in the abdomen or pelvis -Agree with radiology assessment Official radiology report(s): CT ABDOMEN PELVIS W CONTRAST  Result Date: 04/11/2021 CLINICAL DATA:  Right-sided abdominal pain EXAM: CT ABDOMEN AND PELVIS WITH CONTRAST TECHNIQUE: Multidetector CT imaging of the abdomen and pelvis was performed using the standard protocol following bolus administration of  intravenous contrast. RADIATION DOSE REDUCTION: This exam was performed according to the departmental dose-optimization program which includes automated exposure control, adjustment of the mA and/or kV according to patient size and/or use of iterative reconstruction technique. CONTRAST:  140m OMNIPAQUE IOHEXOL 300 MG/ML  SOLN COMPARISON:  None. FINDINGS: Lower chest: Loculated appearing small right pleural effusion is partially imaged. There is adjacent atelectasis. Hepatobiliary: No focal liver abnormality is seen. No gallstones, gallbladder wall thickening, or biliary dilatation. Pancreas: Unremarkable. No pancreatic ductal dilatation or surrounding inflammatory changes. Spleen: Normal in size without focal abnormality. Adrenals/Urinary Tract: Adrenals are unremarkable. Too small to characterize low-density lesions of the right kidney. Bladder is unremarkable. Stomach/Bowel: Stomach is within normal limits. Bowel is normal in caliber. Sigmoid diverticulosis. Vascular/Lymphatic: Atherosclerosis.  No enlarged nodes. Reproductive: Status post hysterectomy. No adnexal masses. Other: No free fluid.  No acute abnormality of the abdominal wall. Musculoskeletal: No acute osseous abnormality. IMPRESSION: Partially imaged loculated appearing small right pleural effusion. No acute abnormality in the abdomen or pelvis. Electronically Signed   By: PMacy MisM.D.   On: 04/11/2021 13:05   PROCEDURES: Critical Care performed: Yes, see critical care procedure note(s) CRITICAL CARE Performed by: ENaaman Plummer Total critical care time: 41 minutes  Critical care time was exclusive of separately billable procedures and treating other patients.  Critical care was necessary to treat or prevent imminent or life-threatening deterioration.  Critical care was time spent personally by me on the following activities: development of treatment plan with patient and/or surrogate as well as nursing, discussions with  consultants, evaluation of patient's response to treatment, examination of patient, obtaining history from patient or surrogate, ordering and performing treatments and interventions, ordering and review of laboratory studies, ordering and review of radiographic studies, pulse oximetry and re-evaluation of patient's condition.  Procedures MEDICATIONS ORDERED IN ED: Medications  diltiazem (CARDIZEM) 125 mg in dextrose 5% 125 mL (1 mg/mL) infusion (0 mg/hr Intravenous Stopped 04/11/21 1735)  amiodarone (NEXTERONE PREMIX) 360-4.14 MG/200ML-% (1.8 mg/mL) IV infusion (60 mg/hr Intravenous New Bag/Given 04/11/21 1739)    Followed by  amiodarone (NEXTERONE PREMIX) 360-4.14 MG/200ML-% (1.8 mg/mL) IV infusion (has no administration in time range)  0.9 %  sodium chloride infusion ( Intravenous New Bag/Given 04/11/21 1738)  acetaminophen (TYLENOL) tablet 650 mg (has no administration in time range)    Or  acetaminophen (TYLENOL) suppository 650 mg (has no administration in time range)  ondansetron (ZOFRAN) tablet 4 mg (has no administration in time range)    Or  ondansetron (ZOFRAN) injection 4 mg (has no administration in time range)  enoxaparin (LOVENOX) injection 40 mg (has no administration in time range)  lactated ringers bolus 1,000 mL (has no administration in time range)  metroNIDAZOLE (FLAGYL) IVPB 500 mg (has no administration  in time range)  sodium chloride 0.9 % bolus 1,000 mL (0 mLs Intravenous Stopped 04/11/21 1743)  metoprolol tartrate (LOPRESSOR) injection 5 mg (5 mg Intravenous Given 04/11/21 1627)  amiodarone (NEXTERONE) 1.8 mg/mL load via infusion 150 mg (150 mg Intravenous Bolus from Bag 04/11/21 1740)  ketorolac (TORADOL) 30 MG/ML injection 15 mg (15 mg Intravenous Given 04/11/21 1740)   IMPRESSION / MDM / ASSESSMENT AND PLAN / ED COURSE  I reviewed the triage vital signs and the nursing notes.                             The patient is on the cardiac monitor to evaluate for evidence of  arrhythmia and/or significant heart rate changes. + atrial fibrillation w/ RVR DDx: Pneumothorax, Pneumonia, Pulmonary Embolus, Tamponade, ACS, Thyrotoxicosis.  No history or evidence decompensated heart failure. Given their history and exam it is likely this patient is unlikely to spontaneously revert to a rate controlled rhythm and necessitates a thorough workup for their arrhythmia. Workup: ECG, CXR, CBC, BMP, UA, Troponin, BNP, TSH, Ca-Mag-Phos Patient incidentally found to have significant leukocytosis(72.5) with predominantly immature granulocytes on differential concerning for possible malignancy. Interventions: Defer Cardioversion (uncertain historical reliability with time of onset, increased risk of thromboembolic stroke).  Start diltiazem bolus and drip.  Patient did not tolerate this due to hypotension and was switched to an amiodarone bolus and drip with good response Consults: Cardiology-Dr. Curt Bears agrees with plan and will see patient Oncology-Dr. Arnette Norris reviewing case and will see patient Hospitalist-Dr. Cox agrees to accept this patient onto the hospitalist service for further evaluation and management  Disposition: Admit    FINAL CLINICAL IMPRESSION(S) / ED DIAGNOSES   Final diagnoses:  Generalized weakness  Hyponatremia  Atrial fibrillation with RVR ([place])  Leukocytosis, unspecified type   Rx / DC Orders   ED Discharge Orders     None      Note:  This document was prepared using Dragon voice recognition software and may include unintentional dictation errors.   Naaman Plummer, MD 04/11/21 [id number]

## 2021-04-11 NOTE — Assessment & Plan Note (Signed)
-   IR as above ?

## 2021-04-11 NOTE — Assessment & Plan Note (Addendum)
-   IR has been consulted for thoracentesis of right pleural effusion, labs ordered including flow cytometry, body fluid stain and culture, lactic dehydrogenase ?- Check MRSA PCR ?- Broad-spectrum antibiotic with cefepime per pharmacy, vancomycin per pharmacy, and metronidazole 500 g every 12 hours ?- Check lactic acid stat ?- 1 L bolus of LR ordered ?

## 2021-04-11 NOTE — Assessment & Plan Note (Signed)
-   Administer additional fluid bolus to complete 30 mill per kilogram ?

## 2021-04-11 NOTE — Hospital Course (Addendum)
[name] is a 81 year old female with medical history of hypothyroid, GERD, who presents to the emergency department from [facility] clinic for chief concerns of shortness of breath, right lower quadrant pain, weakness. ? ?Initial vitals in the emergency department showed temperature of 97.8, respiration rate of 20, heart rate of 102, initial blood pressure 130/96 and decreased to 102/42, SPO2 of 98% on room air, serum sodium 123, potassium 4.0, chloride 87, bicarb 24, BUN of 19, serum creatinine of 0.91, GFR greater than 60, nonfasting blood glucose 88, WBC 72.2, hemoglobin 11.1, platelets of 454. ? ?CT abdomen pelvis with contrast at [facility] clinic was read as: Partially imaged loculated appearing small pleural effusion.  No acute abnormality in the abdomen or pelvis. ? ?ED provider ordered portable chest x-ray and is currently pending. ? ?Patient was found to have new onset atrial fibrillation with RVR. ? ?ED treatment: Metoprolol 5 mg IV, amiodarone 150 mg IV, gtt., diltiazem gtt., ketorolac 15 mg IV one-time dose,  ? ?Patient did not respond to amiodarone gtt. and was initiated on diltiazem gtt. ? ?Patient also received sodium chloride 1 L bolus. ? ?EDP also consulted Dr. Grayland Ormond, and Dr. Curt Bears with [facility] cardiology. ?

## 2021-04-11 NOTE — Assessment & Plan Note (Signed)
-   Etiology work-up in progress at this time ?- Moderate asymptomatic ?- Serum osmolality, urine osmolality, urine sodium ordered ?- Status post sodium chloride 1 L bolus per EDP ?- BMP in the a.m. ?

## 2021-04-11 NOTE — ED Triage Notes (Signed)
Pt via POV from [facility]. Pt was sent over to increased weakness, SOB, and RLQ pain that is now intermittent. Denies urinary symptoms. Denies fevers. Denies NVD. CT was done today and it was normal but is showed a possible R pleural effusion. Blood work from [facility] showed Na+ 128 and WBC of 78.5. Pt is A&Ox4 and NAD.  ?

## 2021-04-11 NOTE — Consult Note (Signed)
Pharmacy Antibiotic Note ? ?Katherine Bradshaw is a 81 y.o. female admitted on 04/11/2021 with sepsis.  Pharmacy has been consulted for cefepime and vancomycin dosing.  ? ?Plan: ?Will start cefepime 2 g q12H  ? ?Will give vancomycin 1500 units loading dose followed by 750 mg q24H. Predicted AUC of 455. Goal AUC of 400-550. Vd 0.72, used IBW, Scr 0.91. Plan to obtain vancomycin levels after 4th or 5th dose.  ? ?Continue flagyl 500 mg q12H.  ? ?Height: '5\' 5"'$  (165.1 cm) ?Weight: 57.2 kg (126 lb) ?IBW/kg (Calculated) : 57 ? ?Temp (24hrs), Avg:97.8 ?F (36.6 ?C), Min:97.8 ?F (36.6 ?C), Max:97.8 ?F (36.6 ?C) ? ?Recent Labs  ?Lab 04/11/21 ?1252 04/11/21 ?1505  ?WBC  --  72.5*  72.2*  ?CREATININE 0.90 0.91  ?  ?Estimated Creatinine Clearance: 42.9 mL/min (by C-G formula based on SCr of 0.91 mg/dL).   ? ?No Known Allergies ? ?Antimicrobials this admission: ?3/9 vancomycin + cefepime + flagyl >>  ? ? ?Dose adjustments this admission: ?None ? ?Microbiology results: ?None ordered.  ? ?Thank you for allowing pharmacy to be a part of this patient?s care. ? ?Oswald Hillock, PharmD ?04/11/2021 6:42 PM ?/ ?

## 2021-04-12 ENCOUNTER — Inpatient Hospital Stay: Payer: PPO

## 2021-04-12 ENCOUNTER — Inpatient Hospital Stay (HOSPITAL_COMMUNITY)
Admit: 2021-04-12 | Discharge: 2021-04-12 | Disposition: A | Discharge status: Home / Self Care | Payer: PPO | Attending: Internal Medicine | Admitting: Internal Medicine

## 2021-04-12 DIAGNOSIS — I5033 Acute on chronic diastolic (congestive) heart failure: Secondary | ICD-10-CM | POA: Diagnosis present

## 2021-04-12 DIAGNOSIS — Z79890 Hormone replacement therapy (postmenopausal): Secondary | ICD-10-CM | POA: Diagnosis not present

## 2021-04-12 DIAGNOSIS — Z85828 Personal history of other malignant neoplasm of skin: Secondary | ICD-10-CM | POA: Diagnosis not present

## 2021-04-12 DIAGNOSIS — I5021 Acute systolic (congestive) heart failure: Secondary | ICD-10-CM | POA: Diagnosis not present

## 2021-04-12 DIAGNOSIS — I509 Heart failure, unspecified: Secondary | ICD-10-CM | POA: Insufficient documentation

## 2021-04-12 DIAGNOSIS — A419 Sepsis, unspecified organism: Secondary | ICD-10-CM | POA: Diagnosis present

## 2021-04-12 DIAGNOSIS — D509 Iron deficiency anemia, unspecified: Secondary | ICD-10-CM | POA: Diagnosis present

## 2021-04-12 DIAGNOSIS — R6520 Severe sepsis without septic shock: Secondary | ICD-10-CM | POA: Diagnosis present

## 2021-04-12 DIAGNOSIS — J159 Unspecified bacterial pneumonia: Secondary | ICD-10-CM | POA: Diagnosis not present

## 2021-04-12 DIAGNOSIS — E871 Hypo-osmolality and hyponatremia: Secondary | ICD-10-CM

## 2021-04-12 DIAGNOSIS — I4891 Unspecified atrial fibrillation: Secondary | ICD-10-CM

## 2021-04-12 DIAGNOSIS — D72823 Leukemoid reaction: Secondary | ICD-10-CM | POA: Diagnosis present

## 2021-04-12 DIAGNOSIS — Z6821 Body mass index (BMI) 21.0-21.9, adult: Secondary | ICD-10-CM | POA: Diagnosis not present

## 2021-04-12 DIAGNOSIS — J189 Pneumonia, unspecified organism: Secondary | ICD-10-CM | POA: Diagnosis not present

## 2021-04-12 DIAGNOSIS — I959 Hypotension, unspecified: Secondary | ICD-10-CM | POA: Diagnosis present

## 2021-04-12 DIAGNOSIS — J154 Pneumonia due to other streptococci: Secondary | ICD-10-CM | POA: Diagnosis present

## 2021-04-12 DIAGNOSIS — J869 Pyothorax without fistula: Secondary | ICD-10-CM | POA: Diagnosis present

## 2021-04-12 DIAGNOSIS — D75838 Other thrombocytosis: Secondary | ICD-10-CM | POA: Diagnosis present

## 2021-04-12 DIAGNOSIS — E039 Hypothyroidism, unspecified: Secondary | ICD-10-CM | POA: Diagnosis present

## 2021-04-12 DIAGNOSIS — E872 Acidosis, unspecified: Secondary | ICD-10-CM | POA: Diagnosis present

## 2021-04-12 DIAGNOSIS — J918 Pleural effusion in other conditions classified elsewhere: Secondary | ICD-10-CM | POA: Diagnosis not present

## 2021-04-12 DIAGNOSIS — R634 Abnormal weight loss: Secondary | ICD-10-CM | POA: Diagnosis present

## 2021-04-12 DIAGNOSIS — Z79899 Other long term (current) drug therapy: Secondary | ICD-10-CM | POA: Diagnosis not present

## 2021-04-12 DIAGNOSIS — K219 Gastro-esophageal reflux disease without esophagitis: Secondary | ICD-10-CM | POA: Diagnosis present

## 2021-04-12 DIAGNOSIS — Z20822 Contact with and (suspected) exposure to covid-19: Secondary | ICD-10-CM | POA: Diagnosis present

## 2021-04-12 DIAGNOSIS — I480 Paroxysmal atrial fibrillation: Secondary | ICD-10-CM | POA: Diagnosis present

## 2021-04-12 LAB — CBC WITH DIFFERENTIAL/PLATELET
Abs Immature Granulocytes: 3.7 10*3/uL — ABNORMAL HIGH (ref 0.00–0.07)
Basophils Absolute: 0 10*3/uL (ref 0.0–0.1)
Basophils Relative: 0 %
Eosinophils Absolute: 0.1 10*3/uL (ref 0.0–0.5)
Eosinophils Relative: 0 %
HCT: 30.2 % — ABNORMAL LOW (ref 36.0–46.0)
Hemoglobin: 9.9 g/dL — ABNORMAL LOW (ref 12.0–15.0)
Immature Granulocytes: 6 %
Lymphocytes Relative: 4 %
Lymphs Abs: 2.6 10*3/uL (ref 0.7–4.0)
MCH: 28.4 pg (ref 26.0–34.0)
MCHC: 32.8 g/dL (ref 30.0–36.0)
MCV: 86.8 fL (ref 80.0–100.0)
Monocytes Absolute: 1.9 10*3/uL — ABNORMAL HIGH (ref 0.1–1.0)
Monocytes Relative: 3 %
Neutro Abs: 52.9 10*3/uL — ABNORMAL HIGH (ref 1.7–7.7)
Neutrophils Relative %: 87 %
Platelets: 385 10*3/uL (ref 150–400)
RBC: 3.48 MIL/uL — ABNORMAL LOW (ref 3.87–5.11)
RDW: 13.5 % (ref 11.5–15.5)
WBC: 60.8 10*3/uL (ref 4.0–10.5)
nRBC: 0 % (ref 0.0–0.2)

## 2021-04-12 LAB — BASIC METABOLIC PANEL
Anion gap: 7 (ref 5–15)
BUN: 12 mg/dL (ref 8–23)
CO2: 21 mmol/L — ABNORMAL LOW (ref 22–32)
Calcium: 7.9 mg/dL — ABNORMAL LOW (ref 8.9–10.3)
Chloride: 99 mmol/L (ref 98–111)
Creatinine, Ser: 0.64 mg/dL (ref 0.44–1.00)
GFR, Estimated: >60 mL/min (ref >60)
Glucose, Bld: 106 mg/dL — ABNORMAL HIGH (ref 70–99)
Potassium: 3.9 mmol/L (ref 3.5–5.1)
Sodium: 127 mmol/L — ABNORMAL LOW (ref 135–145)

## 2021-04-12 LAB — ECHOCARDIOGRAM COMPLETE
AR max vel: 2.77 cm2
AV Area VTI: 2.9 cm2
AV Area mean vel: 2.57 cm2
AV Mean grad: 4 mmHg
AV Peak grad: 7.2 mmHg
Ao pk vel: 1.34 m/s
Area-P 1/2: 4.96 cm2
Height: 65 in
MV VTI: 2.08 cm2
S' Lateral: 2.32 cm
Weight: 2063.51 oz

## 2021-04-12 LAB — MRSA NEXT GEN BY PCR, NASAL: MRSA by PCR Next Gen: NOT DETECTED

## 2021-04-12 LAB — FOLATE: Folate: 13.3 ng/mL (ref >5.9)

## 2021-04-12 LAB — BODY FLUID CELL COUNT WITH DIFFERENTIAL
Eos, Fluid: 0 %
Lymphs, Fluid: 5 %
Monocyte-Macrophage-Serous Fluid: 5 %
Neutrophil Count, Fluid: 90 %
Total Nucleated Cell Count, Fluid: 5842 cu mm

## 2021-04-12 LAB — IRON AND TIBC
Iron: 14 ug/dL — ABNORMAL LOW (ref 28–170)
Saturation Ratios: 7 % — ABNORMAL LOW (ref 10.4–31.8)
TIBC: 196 ug/dL — ABNORMAL LOW (ref 250–450)
UIBC: 182 ug/dL

## 2021-04-12 LAB — OSMOLALITY: Osmolality: 264 mOsm/kg — ABNORMAL LOW (ref 275–295)

## 2021-04-12 LAB — FERRITIN: Ferritin: 164 ng/mL (ref 11–307)

## 2021-04-12 LAB — PHOSPHORUS: Phosphorus: 2.5 mg/dL (ref 2.5–4.6)

## 2021-04-12 LAB — LACTIC ACID, PLASMA: Lactic Acid, Venous: 1.9 mmol/L (ref 0.5–1.9)

## 2021-04-12 LAB — LACTATE DEHYDROGENASE, PLEURAL OR PERITONEAL FLUID: LD, Fluid: 3185 U/L — ABNORMAL HIGH (ref 3–23)

## 2021-04-12 LAB — PROTIME-INR
INR: 1.3 — ABNORMAL HIGH (ref 0.8–1.2)
Prothrombin Time: 15.9 seconds — ABNORMAL HIGH (ref 11.4–15.2)

## 2021-04-12 LAB — TROPONIN I (HIGH SENSITIVITY): Troponin I (High Sensitivity): 38 ng/L — ABNORMAL HIGH (ref <18)

## 2021-04-12 LAB — APTT: aPTT: 56 seconds — ABNORMAL HIGH (ref 24–36)

## 2021-04-12 LAB — BRAIN NATRIURETIC PEPTIDE: B Natriuretic Peptide: 562.5 pg/mL — ABNORMAL HIGH (ref 0.0–100.0)

## 2021-04-12 LAB — LACTATE DEHYDROGENASE: LDH: 159 U/L (ref 98–192)

## 2021-04-12 LAB — VITAMIN B12: Vitamin B-12: 3581 pg/mL — ABNORMAL HIGH (ref 180–914)

## 2021-04-12 LAB — CORTISOL-AM, BLOOD: Cortisol - AM: 21.5 ug/dL (ref 6.7–22.6)

## 2021-04-12 LAB — MAGNESIUM: Magnesium: 1.6 mg/dL — ABNORMAL LOW (ref 1.7–2.4)

## 2021-04-12 LAB — PATHOLOGIST SMEAR REVIEW

## 2021-04-12 MED ORDER — METRONIDAZOLE 500 MG PO TABS
500.0000 mg | ORAL_TABLET | Freq: Two times a day (BID) | ORAL | Status: DC
  Administered 2021-04-12 – 2021-04-13 (×4): 500 mg via ORAL
  Filled 2021-04-12 (×5): qty 1

## 2021-04-12 MED ORDER — SODIUM CHLORIDE 0.9 % IV SOLN
2.0000 g | INTRAVENOUS | Status: DC
  Administered 2021-04-12: 2 g via INTRAVENOUS
  Filled 2021-04-12: qty 2
  Filled 2021-04-12: qty 20

## 2021-04-12 MED ORDER — AMIODARONE HCL 200 MG PO TABS
200.0000 mg | ORAL_TABLET | Freq: Two times a day (BID) | ORAL | Status: DC
  Administered 2021-04-12 – 2021-04-16 (×9): 200 mg via ORAL
  Filled 2021-04-12 (×9): qty 1

## 2021-04-12 MED ORDER — FUROSEMIDE 10 MG/ML IJ SOLN
40.0000 mg | Freq: Every day | INTRAMUSCULAR | Status: DC
  Administered 2021-04-12 – 2021-04-13 (×2): 40 mg via INTRAVENOUS
  Filled 2021-04-12 (×2): qty 4

## 2021-04-12 MED ORDER — SODIUM CHLORIDE 0.9 % IV SOLN
500.0000 mg | INTRAVENOUS | Status: DC
  Administered 2021-04-12: 500 mg via INTRAVENOUS
  Filled 2021-04-12 (×2): qty 5

## 2021-04-12 MED ORDER — MAGNESIUM SULFATE 2 GM/50ML IV SOLN
2.0000 g | Freq: Once | INTRAVENOUS | Status: AC
  Administered 2021-04-12: 2 g via INTRAVENOUS
  Filled 2021-04-12: qty 50

## 2021-04-12 NOTE — Assessment & Plan Note (Addendum)
Thoracentesis culture negative.  CT scan of the chest concerning for empyema.  Chest tube placed on 04/16/2021.  Chest tube removed on 04/19/2021 after speaking with IR and pulmonary.  Patient received Unasyn while here.  Case discussed with infectious disease pharmacist and will give 3 more weeks of antibiotics upon disposition.  Antibiotics changed over to Augmentin.  Close follow-up with pulmonary as outpatient.  With ASO titers elevated likely secondary to Streptococcus.

## 2021-04-12 NOTE — Assessment & Plan Note (Addendum)
Condition improving.

## 2021-04-12 NOTE — Consult Note (Signed)
[place]  [phone] [phone]) [phone]  ID: Ollen Bowl OB: 02/17/1940  MR#: [id number]  FYB#:[id number]  Patient Care Team: Rusty Aus, MD as PCP - General (Internal Medicine)  CHIEF COMPLAINT: Leukocytosis  INTERVAL HISTORY: Patient is an 81 year old female who initially presented to the hospital with increasing shortness of breath and weakness and fatigue.  She was found to have new onset atrial fibrillation, bacterial pneumonia, and a significantly elevated white blood cell count of 72,000.  She currently feels well and improved since admission.  She has no neurologic complaints.  She denies any fevers.  She has a fair appetite, but denies weight loss.  She has no chest pain or hemoptysis.  She denies any nausea, vomiting, constipation, or diarrhea.  She has no urinary complaints.  Patient offers no further specific complaints today.  REVIEW OF SYSTEMS:   Review of Systems  Constitutional:  Positive for malaise/fatigue. Negative for fever and weight loss.  Respiratory:  Positive for shortness of breath. Negative for cough.   Cardiovascular: Negative.  Negative for chest pain and leg swelling.  Gastrointestinal: Negative.  Negative for abdominal pain.  Genitourinary: Negative.  Negative for dysuria.  Musculoskeletal: Negative.  Negative for back pain.  Skin: Negative.  Negative for rash.  Neurological:  Positive for weakness. Negative for dizziness, focal weakness and headaches.  Psychiatric/Behavioral:  The patient is not nervous/anxious.    As per HPI. Otherwise, a complete review of systems is negative.  PAST MEDICAL HISTORY: Past Medical History:  Diagnosis Date   Cancer ([place])    skin    PAST SURGICAL HISTORY: Past Surgical History:  Procedure Laterality Date   BREAST BIOPSY Bilateral    neg    FAMILY HISTORY: Family History  Problem Relation Age of Onset   Breast cancer Sister 27    ADVANCED DIRECTIVES (Y/N):   _0 @  HEALTH MAINTENANCE: Social History   Tobacco Use   Smoking status: Never   Smokeless tobacco: Never  Substance Use Topics   Alcohol use: Not Currently   Drug use: Never     Colonoscopy:  PAP:  Bone density:  Lipid panel:  No Known Allergies  Current Facility-Administered Medications  Medication Dose Route Frequency Provider Last Rate Last Admin   acetaminophen (TYLENOL) tablet 650 mg  650 mg Oral Q6H PRN Cox, Amy N, DO       Or   acetaminophen (TYLENOL) suppository 650 mg  650 mg Rectal Q6H PRN Cox, Amy N, DO       amiodarone (PACERONE) tablet 200 mg  200 mg Oral BID Sharen Hones, MD   200 mg at 04/12/21 1025   azithromycin (ZITHROMAX) 500 mg in sodium chloride 0.9 % 250 mL IVPB  500 mg Intravenous Q24H Sharen Hones, MD 250 mL/hr at 04/12/21 1208 500 mg at 04/12/21 1208   cefTRIAXone (ROCEPHIN) 2 g in sodium chloride 0.9 % 100 mL IVPB  2 g Intravenous Q24H Sharen Hones, MD 200 mL/hr at 04/12/21 1026 2 g at 04/12/21 1026   furosemide (LASIX) injection 40 mg  40 mg Intravenous Daily Sharen Hones, MD   40 mg at 04/12/21 1024   latanoprost (XALATAN) 0.005 % ophthalmic solution 1 drop  1 drop Both Eyes QHS Cox, Amy N, DO   1 drop at 04/11/21 2345   levothyroxine (SYNTHROID) tablet 75 mcg  75 mcg Oral Q0600 Cox, Amy N, DO   75 mcg at 04/12/21 5277   metroNIDAZOLE (FLAGYL) tablet 500 mg  500 mg  Oral Q12H Sharen Hones, MD   500 mg at 04/12/21 1230   multivitamin with minerals tablet 1 tablet  1 tablet Oral Daily Cox, Amy N, DO   1 tablet at 04/11/21 2203   ondansetron (ZOFRAN) tablet 4 mg  4 mg Oral Q6H PRN Cox, Amy N, DO       Or   ondansetron (ZOFRAN) injection 4 mg  4 mg Intravenous Q6H PRN Cox, Amy N, DO       pantoprazole (PROTONIX) EC tablet 40 mg  40 mg Oral Daily Cox, Amy N, DO   40 mg at 04/12/21 0906   vitamin B-12 (CYANOCOBALAMIN) tablet 1,000 mcg  1,000 mcg Oral Daily Cox, Amy N, DO   1,000 mcg at 04/11/21 2203    OBJECTIVE: Vitals:   04/12/21 0702 04/12/21  1151  BP: (!) 153/58 (!) 146/57  Pulse: 77 86  Resp: 18 19  Temp: 98 F (36.7 C) (!) 97.5 F (36.4 C)  SpO2: 98% 97%     Body mass index is 21.46 kg/m.    ECOG FS:1 - Symptomatic but completely ambulatory  General: Well-developed, well-nourished, no acute distress. Eyes: Pink conjunctiva, anicteric sclera. HEENT: Normocephalic, moist mucous membranes. Lungs: No audible wheezing or coughing. Heart: Regular rate and rhythm. Abdomen: Soft, nontender, no obvious distention. Musculoskeletal: No edema, cyanosis, or clubbing. Neuro: Alert, answering all questions appropriately. Cranial nerves grossly intact. Skin: No rashes or petechiae noted. Psych: Normal affect. Lymphatics: No cervical, calvicular, axillary or inguinal LAD.   LAB RESULTS:  Lab Results  Component Value Date   NA 127 (L) 04/12/2021   K 3.9 04/12/2021   CL 99 04/12/2021   CO2 21 (L) 04/12/2021   GLUCOSE 106 (H) 04/12/2021   BUN 12 04/12/2021   CREATININE 0.64 04/12/2021   CALCIUM 7.9 (L) 04/12/2021   GFRNONAA >60 04/12/2021    Lab Results  Component Value Date   WBC 60.8 (HH) 04/12/2021   NEUTROABS 52.9 (H) 04/12/2021   HGB 9.9 (L) 04/12/2021   HCT 30.2 (L) 04/12/2021   MCV 86.8 04/12/2021   PLT 385 04/12/2021     STUDIES: CT ABDOMEN PELVIS W CONTRAST  Result Date: 04/11/2021 CLINICAL DATA:  Right-sided abdominal pain EXAM: CT ABDOMEN AND PELVIS WITH CONTRAST TECHNIQUE: Multidetector CT imaging of the abdomen and pelvis was performed using the standard protocol following bolus administration of intravenous contrast. RADIATION DOSE REDUCTION: This exam was performed according to the departmental dose-optimization program which includes automated exposure control, adjustment of the mA and/or kV according to patient size and/or use of iterative reconstruction technique. CONTRAST:  153m OMNIPAQUE IOHEXOL 300 MG/ML  SOLN COMPARISON:  None. FINDINGS: Lower chest: Loculated appearing small right pleural  effusion is partially imaged. There is adjacent atelectasis. Hepatobiliary: No focal liver abnormality is seen. No gallstones, gallbladder wall thickening, or biliary dilatation. Pancreas: Unremarkable. No pancreatic ductal dilatation or surrounding inflammatory changes. Spleen: Normal in size without focal abnormality. Adrenals/Urinary Tract: Adrenals are unremarkable. Too small to characterize low-density lesions of the right kidney. Bladder is unremarkable. Stomach/Bowel: Stomach is within normal limits. Bowel is normal in caliber. Sigmoid diverticulosis. Vascular/Lymphatic: Atherosclerosis.  No enlarged nodes. Reproductive: Status post hysterectomy. No adnexal masses. Other: No free fluid.  No acute abnormality of the abdominal wall. Musculoskeletal: No acute osseous abnormality. IMPRESSION: Partially imaged loculated appearing small right pleural effusion. No acute abnormality in the abdomen or pelvis. Electronically Signed   By: PMacy MisM.D.   On: 04/11/2021 13:05   [facility]  Result Date: 04/11/2021 CLINICAL DATA:  Evaluate pleural effusion seen on CT. EXAM: PORTABLE CHEST 1 VIEW COMPARISON:  None. FINDINGS: Mild to moderate severity diffuse, chronic appearing increased interstitial lung markings are seen. Mild areas of atelectasis are noted within the bilateral lung bases. There are very small bilateral pleural effusions. No pneumothorax is identified. The heart size and mediastinal contours are within normal limits. There is marked severity calcification of the aortic arch. The visualized skeletal structures are unremarkable. IMPRESSION: 1. Chronic appearing increased interstitial lung markings. Mild, superimposed component of interstitial edema cannot be excluded. 2. Mild bibasilar atelectasis. 3. Very small bilateral pleural effusions. Electronically Signed   By: Virgina Norfolk M.D.   On: 04/11/2021 18:59   ECHOCARDIOGRAM COMPLETE  Result Date: 04/12/2021    ECHOCARDIOGRAM  REPORT   Patient Name:   Katherine Bradshaw Date of Exam: 04/12/2021 Medical Rec #:  [id number]       Height:       65.0 in Accession #:    [id number]      Weight:       129.0 lb Date of Birth:  March 05, 1940       BSA:          1.642 m Patient Age:    22 years        BP:           146/57 mmHg Patient Gender: F               HR:           66 bpm. Exam Location:  [facility] Procedure: 2D Echo, Color Doppler and Cardiac Doppler Indications:     I48.91 Atrial fibrillation  History:         Patient has no prior history of Echocardiogram examinations.                  Signs/Symptoms:Shortness of Breath and Weakness.  Sonographer:     Charmayne Sheer Referring Phys:  [id number] AMY N COX Diagnosing Phys: Ida Rogue MD IMPRESSIONS  1. Left ventricular ejection fraction, by estimation, is 60 to 65%. The left ventricle has normal function. The left ventricle has no regional wall motion abnormalities. Left ventricular diastolic parameters are consistent with Grade I diastolic dysfunction (impaired relaxation).  2. Right ventricular systolic function is normal. The right ventricular size is normal.  3. The mitral valve is normal in structure. Moderate mitral valve regurgitation. No evidence of mitral stenosis. There is mild late systolic prolapse of multiple scallops of the posterior leaflet of the mitral valve.  4. The aortic valve is tricuspid. Aortic valve regurgitation is not visualized. No aortic stenosis is present.  5. The inferior vena cava is normal in size with greater than 50% respiratory variability, suggesting right atrial pressure of 3 mmHg. FINDINGS  Left Ventricle: Left ventricular ejection fraction, by estimation, is 60 to 65%. The left ventricle has normal function. The left ventricle has no regional wall motion abnormalities. The left ventricular internal cavity size was normal in size. There is  no left ventricular hypertrophy. Left ventricular diastolic parameters are consistent with Grade I diastolic dysfunction (impaired  relaxation). Right Ventricle: The right ventricular size is normal. No increase in right ventricular wall thickness. Right ventricular systolic function is normal. Left Atrium: Left atrial size was normal in size. Right Atrium: Right atrial size was normal in size. Pericardium: There is no evidence of pericardial effusion. Mitral Valve: The mitral valve is normal in structure. There  is mild late systolic prolapse of multiple scallops of the posterior leaflet of the mitral valve. Moderate mitral valve regurgitation. No evidence of mitral valve stenosis. MV peak gradient, 7.0 mmHg. The mean mitral valve gradient is 2.5 mmHg. Tricuspid Valve: The tricuspid valve is normal in structure. Tricuspid valve regurgitation is mild . No evidence of tricuspid stenosis. Aortic Valve: The aortic valve is tricuspid. Aortic valve regurgitation is not visualized. No aortic stenosis is present. Aortic valve mean gradient measures 4.0 mmHg. Aortic valve peak gradient measures 7.2 mmHg. Aortic valve area, by VTI measures 2.90 cm. Pulmonic Valve: The pulmonic valve was normal in structure. Pulmonic valve regurgitation is trivial. No evidence of pulmonic stenosis. Aorta: The aortic root is normal in size and structure. Venous: The inferior vena cava is normal in size with greater than 50% respiratory variability, suggesting right atrial pressure of 3 mmHg. IAS/Shunts: No atrial level shunt detected by color flow Doppler.  LEFT VENTRICLE PLAX 2D LVIDd:         3.71 cm   Diastology LVIDs:         2.32 cm   LV e' lateral:   8.86 cm/s LV PW:         1.03 cm   LV E/e' lateral: 14.9 LV IVS:        0.84 cm LVOT diam:     2.00 cm LV SV:         63 LV SV Index:   38 LVOT Area:     3.14 cm  RIGHT VENTRICLE RV Basal diam:  2.54 cm LEFT ATRIUM           Index        RIGHT ATRIUM          Index LA diam:      3.10 cm 1.89 cm/m   RA Area:     8.89 cm LA Vol (A2C): 23.5 ml 14.32 ml/m  RA Volume:   15.40 ml 9.38 ml/m LA Vol (A4C): 19.5 ml 11.88  ml/m  AORTIC VALVE                    PULMONIC VALVE AV Area (Vmax):    2.77 cm     PV Vmax:          0.88 m/s AV Area (Vmean):   2.57 cm     PV Vmean:         59.800 cm/s AV Area (VTI):     2.90 cm     PV VTI:           0.156 m AV Vmax:           134.00 cm/s  PV Peak grad:     3.1 mmHg AV Vmean:          87.200 cm/s  PV Mean grad:     2.0 mmHg AV VTI:            0.218 m      PR End Diast Vel: 4.67 msec AV Peak Grad:      7.2 mmHg AV Mean Grad:      4.0 mmHg LVOT Vmax:         118.00 cm/s LVOT Vmean:        71.300 cm/s LVOT VTI:          0.201 m LVOT/AV VTI ratio: 0.92  AORTA Ao Root diam: 3.20 cm MITRAL VALVE MV Area (PHT): 4.96 cm     SHUNTS MV Area  VTI:   2.08 cm     Systemic VTI:  0.20 m MV Peak grad:  7.0 mmHg     Systemic Diam: 2.00 cm MV Mean grad:  2.5 mmHg MV Vmax:       1.32 m/s MV Vmean:      74.8 cm/s MV Decel Time: 153 msec MV E velocity: 131.67 cm/s MV A velocity: 92.45 cm/s MV E/A ratio:  1.42 Ida Rogue MD Electronically signed by Ida Rogue MD Signature Date/Time: 04/12/2021/12:50:34 PM    Final     ASSESSMENT: Leukocytosis.  PLAN:    1.  Leukocytosis: Patient initially was noted to have a white count of 72,000, but has trended down to 60,000 over the past 24 hours.  This is likely reactive, but will order peripheral blood flow cytometry and BCR-ABL mutation for completeness.  No intervention is needed at this time.  Patient does not require bone marrow biopsy.  Will arrange follow-up in the cancer center in 2 to 3 weeks for repeat laboratory work and further evaluation. 2.  Anemia: Mild.  Patient's most recent hemoglobin is 9.9.  Will order iron stores, B12, folate for completeness. 3.  Disposition: Okay to discharge from a hematology standpoint.  Follow-up in the cancer center as above in 2 to 3 weeks.  Appreciate consult, will follow.  Lloyd Huger, MD   04/12/2021 2:13 PM

## 2021-04-12 NOTE — Procedures (Signed)
Ultrasound-guided diagnostic and therapeutic right sided thoracentesis performed yielding 50 milliters of amber colored fluid. No immediate complications.   Diagnostic fluid was sent to the lab for further analysis. Follow-up chest x-ray pending. EBL is < 2 ml. Loculated phelgom pocket noted to be in the right base. Are in question to thick for thoracentesis.  ?     ? ?

## 2021-04-12 NOTE — Progress Notes (Addendum)
Patient here from ED via stretcher. Scooted over to the bed with minimal assist. Alert and oriented x 4. Vitals taken and recorded. Denies pain. Admits some SOB. No open areas or rashes noted to skin. Currently have LR infusing at 125 and Amiodarone infusing at 16.59m/hr. 20 gauge IV noted to left hand. Placed on telemetry and called to CCMD x 2 persons. Patient states that she has not fallen and that she walks usually with a cane. High fall risk due to age. No distress. Patient's belongings near her and call light explained and placed within her reach. Report given to oncoming nurse, MStatistician  ?

## 2021-04-12 NOTE — ED Notes (Signed)
Pt up to [facility] with large loose BM. While assisting pt with cleaning, rhythm change noted. Pt reports decreased SOB.  ?

## 2021-04-12 NOTE — Assessment & Plan Note (Addendum)
Chadsvasc score is 4, patient is back to sinus again.  Discontinue Eliquis just in case patient need chest tube placement.

## 2021-04-12 NOTE — Assessment & Plan Note (Addendum)
Sodium 132 upon discharge

## 2021-04-12 NOTE — Assessment & Plan Note (Addendum)
Patient had a recent viral infection, followed by cough and shortness of breath.  Procalcitonin level elevated.  Reviewed chest x-ray, most likely has bilateral lower lobe pneumonia. This is a postviral bacterial pneumonia.   Positive ASO, likely group A strep pneumonia. Continue on Unasyn for now.

## 2021-04-12 NOTE — Assessment & Plan Note (Addendum)
Patient met severe sepsis criteria with severe leukocytosis, tachypnea and tachycardia.  Patient also has elevated lactic acid.  She received IV fluids, condition has improved. Blood culture negative.

## 2021-04-12 NOTE — Progress Notes (Addendum)
?Progress Note ? ? ?Patient: Katherine Bradshaw OJJ:[id number] DOB: 04-05-1940 DOA: 04/11/2021     0 ?DOS: the patient was seen and examined on 04/12/2021 ?  ?Brief hospital course: ?[name] is a 80 year old female with medical history of hypothyroid, GERD, who presents to the emergency department from [facility] clinic for chief concerns of shortness of breath, right lower quadrant pain, weakness. ?Patient had shingles about a month ago, had upper respiratory infection for the last week.  Started have a cough and shortness of breath.  Also had right lower quadrant abdominal pain, which he has resolved after 2 episode of diarrhea at early morning of 3/10. ?Upon arriving the hospital, she had severe leukocytosis with white cell count 72.2, reactive thrombocytosis, lactic acidosis of 2.7.  Sodium 123.  Her vital signs showed atrial fibrillation with RVR 126, tachypnea.  She was placed on broad-spectrum antibiotics for severe sepsis.  Her CT scan of abdomen/pelvis did not show any acute changes.  Lower chest had a small loculated pleural effusion.  However on chest x-ray, there is very small amount of pleural effusion. ? ?Assessment and Plan: ?* New onset atrial fibrillation ([place]) ?Heart rate better controlled, will hold anticoagulation after all invasive procedures are considered or completed. ?Discontinue IV amiodarone, start oral amnio. ?Patient has converted to sinus this morning. ? ?Secondary bacterial pneumonia ?Patient had a recent viral infection, followed by cough and shortness of breath.  Procalcitonin level elevated.  Reviewed chest x-ray, most likely has bilateral lower lobe pneumonia. ?This is a postviral bacterial pneumonia.  We will treat with Rocephin and Zithromax. ?Patient also has significant hyponatremia, will check Legionella antigen. ? ?Acute CHF ([place]) ?Patient condition is consistent with new onset congestive heart failure. ?She had a recent viral infection, consider the possibility of  myocarditis versus other cause of congestive heart failure. ?Reviewed the EKG, showed atrial fibrillation with T wave changes. ?Check a troponin and echocardiogram. ?Patient is hemodynamically stable now, will start IV Lasix at 40 mg daily.  Monitor electrolytes. ? ?Severe sepsis ([place]) ?Patient met severe sepsis criteria with severe leukocytosis, tachypnea and tachycardia.  Patient also has elevated lactic acid.  She received IV fluids, condition has improved. ? ?Hypothyroidism ?Continue Synthroid. ? ?Leukocytosis ?Patient has a leukemoid reaction with a severe leukocytosis.  It appears to be getting better.  Continue antibiotics for today.  If it does not get better dramatically, will obtain consult from hematology. ? ?Loculated pleural effusion ?Chest x-ray and it showed very small amount of pleural effusion.  May not amendable for paracentesis. ? ?Hyponatremia ?As above.  Continue monitor sodium level. ? ?Pleural effusion on right ?Reviewed the CT and the chest x-ray images, pleural effusion appears to be small.  Will await decision from IR to see if we can drain it. ? ? ? ? ?  ? ?Subjective:  ?Patient had lower quadrant abdominal cramping pain last night, she has a history of appendectomy.  She will subsequently had a too large diarrhea, abdominal pain was better. ?She was short of breath last night with mild cough without mucus. ?Did not have any fever or chills. ? ?Physical Exam: ?Vitals:  ? 04/12/21 0600 04/12/21 0616 04/12/21 0702 04/12/21 0714  ?BP: (!) 129/51  (!) 153/58   ?Pulse: 83  77   ?Resp: 20  18   ?Temp:  98.9 ?F (37.2 ?C) 98 ?F (36.7 ?C)   ?TempSrc:  Oral Oral   ?SpO2: 94%  98%   ?Weight:    58.5  kg  ?Height:    5' 5" (1.651 m)  ? ?General exam: Appears calm and comfortable  ?Respiratory system: Decreased breathing sound and crackles in the right lower field. Respiratory effort normal. ?Cardiovascular system: S1 & S2 heard, RRR. No JVD, murmurs, rubs, gallops or clicks. No pedal  edema. ?Gastrointestinal system: Abdomen is nondistended, soft and nontender. No organomegaly or masses felt. Normal bowel sounds heard. ?Central nervous system: Alert and oriented. No focal neurological deficits. ?Extremities: Symmetric 5 x 5 power. ?Skin: No rashes, lesions or ulcers ?Psychiatry: Judgement and insight appear normal. Mood & affect appropriate.   ?Data Reviewed: ?Reviewed all labs, compared outside labs performed on 4/22. ?Reviewed all imaging studies.  Independently reviewed images of x-ray and CT scan. ? ?Family Communication: updated son ? ?Disposition: ?Status is: Inpatient ?Remains inpatient appropriate because: severity of diseases, iv treatment ? Planned Discharge Destination: Home with Home Health ? ? ? ?Time spent: 50 minutes ? ?Author: ?Sharen Hones, MD ?04/12/2021 10:15 AM ? ?For on call review [email or web address]  ?

## 2021-04-12 NOTE — Hospital Course (Addendum)
[name] is a 81 year old female with medical history of hypothyroid, GERD, who presents to the emergency department from [facility] clinic for chief concerns of shortness of breath, right lower quadrant pain, weakness. Patient had shingles about a month ago, had upper respiratory infection for the last week.  Started have a cough and shortness of breath.  Also had right lower quadrant abdominal pain, which he has resolved after 2 episode of diarrhea at early morning of 3/10. Upon arriving the hospital, she had severe leukocytosis with white cell count 72.2, reactive thrombocytosis, lactic acidosis of 2.7.  Sodium 123.  Her vital signs showed atrial fibrillation with RVR 126, tachypnea.  She was placed on broad-spectrum antibiotics for severe sepsis.  Her CT scan of abdomen/pelvis did not show any acute changes.  Lower chest had a small loculated pleural effusion.  However on chest x-ray, there is very small amount of pleural effusion. Thoracentesis was performed on 3/10, removed 50 mL of fluids.  LDH 3085, neutrophil 90% with total white cell 5842.  3/12.  Condition improving, antibiotic changed to Unasyn.

## 2021-04-12 NOTE — Assessment & Plan Note (Deleted)
As above.

## 2021-04-12 NOTE — Assessment & Plan Note (Signed)
Continue Synthroid °

## 2021-04-12 NOTE — Progress Notes (Signed)
*  PRELIMINARY RESULTS* ?Echocardiogram ?2D Echocardiogram has been performed. ? ?Katherine Bradshaw ?04/12/2021, 11:48 AM ?

## 2021-04-12 NOTE — Assessment & Plan Note (Signed)
Improved.

## 2021-04-12 NOTE — Assessment & Plan Note (Deleted)
Patient condition is consistent with new onset congestive heart failure. ?She had a recent viral infection, consider the possibility of myocarditis versus other cause of congestive heart failure. ?Reviewed the EKG, showed atrial fibrillation with T wave changes. ?Check a troponin and echocardiogram. ?Patient is hemodynamically stable now, will start IV Lasix at 40 mg daily.  Monitor electrolytes. ?

## 2021-04-13 DIAGNOSIS — J159 Unspecified bacterial pneumonia: Secondary | ICD-10-CM

## 2021-04-13 DIAGNOSIS — J189 Pneumonia, unspecified organism: Secondary | ICD-10-CM

## 2021-04-13 DIAGNOSIS — R6520 Severe sepsis without septic shock: Secondary | ICD-10-CM

## 2021-04-13 DIAGNOSIS — A419 Sepsis, unspecified organism: Secondary | ICD-10-CM

## 2021-04-13 DIAGNOSIS — I4891 Unspecified atrial fibrillation: Secondary | ICD-10-CM | POA: Diagnosis not present

## 2021-04-13 DIAGNOSIS — D509 Iron deficiency anemia, unspecified: Secondary | ICD-10-CM

## 2021-04-13 DIAGNOSIS — J918 Pleural effusion in other conditions classified elsewhere: Secondary | ICD-10-CM

## 2021-04-13 DIAGNOSIS — I5033 Acute on chronic diastolic (congestive) heart failure: Secondary | ICD-10-CM | POA: Diagnosis not present

## 2021-04-13 LAB — BASIC METABOLIC PANEL
Anion gap: 5 (ref 5–15)
BUN: 11 mg/dL (ref 8–23)
CO2: 28 mmol/L (ref 22–32)
Calcium: 7.8 mg/dL — ABNORMAL LOW (ref 8.9–10.3)
Chloride: 101 mmol/L (ref 98–111)
Creatinine, Ser: 0.7 mg/dL (ref 0.44–1.00)
GFR, Estimated: >60 mL/min (ref >60)
Glucose, Bld: 98 mg/dL (ref 70–99)
Potassium: 3.3 mmol/L — ABNORMAL LOW (ref 3.5–5.1)
Sodium: 134 mmol/L — ABNORMAL LOW (ref 135–145)

## 2021-04-13 LAB — CBC WITH DIFFERENTIAL/PLATELET
Abs Immature Granulocytes: 1.19 10*3/uL — ABNORMAL HIGH (ref 0.00–0.07)
Basophils Absolute: 0.1 10*3/uL (ref 0.0–0.1)
Basophils Relative: 0 %
Eosinophils Absolute: 0.2 10*3/uL (ref 0.0–0.5)
Eosinophils Relative: 1 %
HCT: 29.7 % — ABNORMAL LOW (ref 36.0–46.0)
Hemoglobin: 9.9 g/dL — ABNORMAL LOW (ref 12.0–15.0)
Immature Granulocytes: 3 %
Lymphocytes Relative: 7 %
Lymphs Abs: 2.5 10*3/uL (ref 0.7–4.0)
MCH: 28.4 pg (ref 26.0–34.0)
MCHC: 33.3 g/dL (ref 30.0–36.0)
MCV: 85.3 fL (ref 80.0–100.0)
Monocytes Absolute: 1.7 10*3/uL — ABNORMAL HIGH (ref 0.1–1.0)
Monocytes Relative: 5 %
Neutro Abs: 29.3 10*3/uL — ABNORMAL HIGH (ref 1.7–7.7)
Neutrophils Relative %: 84 %
Platelets: 417 10*3/uL — ABNORMAL HIGH (ref 150–400)
RBC: 3.48 MIL/uL — ABNORMAL LOW (ref 3.87–5.11)
RDW: 13.3 % (ref 11.5–15.5)
WBC: 35 10*3/uL — ABNORMAL HIGH (ref 4.0–10.5)
nRBC: 0 % (ref 0.0–0.2)

## 2021-04-13 LAB — C-REACTIVE PROTEIN: CRP: 31.3 mg/dL — ABNORMAL HIGH (ref <1.0)

## 2021-04-13 LAB — MAGNESIUM: Magnesium: 2 mg/dL (ref 1.7–2.4)

## 2021-04-13 LAB — PROCALCITONIN: Procalcitonin: 1.19 ng/mL

## 2021-04-13 MED ORDER — SODIUM CHLORIDE 0.9 % IV SOLN
2.0000 g | INTRAVENOUS | Status: DC
  Administered 2021-04-13: 2 g via INTRAVENOUS
  Filled 2021-04-13 (×2): qty 20

## 2021-04-13 MED ORDER — DILTIAZEM HCL ER 60 MG PO CP12
120.0000 mg | ORAL_CAPSULE | Freq: Two times a day (BID) | ORAL | Status: DC
  Administered 2021-04-13 – 2021-04-19 (×13): 120 mg via ORAL
  Filled 2021-04-13 (×14): qty 2

## 2021-04-13 MED ORDER — METOPROLOL TARTRATE 5 MG/5ML IV SOLN
5.0000 mg | Freq: Four times a day (QID) | INTRAVENOUS | Status: DC | PRN
  Administered 2021-04-13: 5 mg via INTRAVENOUS
  Filled 2021-04-13: qty 5

## 2021-04-13 MED ORDER — SODIUM CHLORIDE 0.9 % IV SOLN: 500.0000 mg | INTRAVENOUS | Status: DC

## 2021-04-13 MED ORDER — POTASSIUM CHLORIDE IN NACL 20-0.9 MEQ/L-% IV SOLN
INTRAVENOUS | Status: DC
  Filled 2021-04-13: qty 1000

## 2021-04-13 MED ORDER — APIXABAN 2.5 MG PO TABS
2.5000 mg | ORAL_TABLET | Freq: Two times a day (BID) | ORAL | Status: DC
  Administered 2021-04-13 – 2021-04-15 (×5): 2.5 mg via ORAL
  Filled 2021-04-13 (×5): qty 1

## 2021-04-13 MED ORDER — POTASSIUM CHLORIDE 10 MEQ/100ML IV SOLN
10.0000 meq | INTRAVENOUS | Status: AC
  Administered 2021-04-13 (×2): 10 meq via INTRAVENOUS
  Filled 2021-04-13 (×2): qty 100

## 2021-04-13 MED ORDER — POLYSACCHARIDE IRON COMPLEX 150 MG PO CAPS
150.0000 mg | ORAL_CAPSULE | Freq: Every day | ORAL | Status: DC
  Administered 2021-04-13 – 2021-04-19 (×7): 150 mg via ORAL
  Filled 2021-04-13 (×7): qty 1

## 2021-04-13 NOTE — Consult Note (Signed)
ANTICOAGULATION CONSULT NOTE ? ?Pharmacy Consult for Apixaban ?Indication: atrial fibrillation ? ?Patient Measurements: ?Height: '5\' 5"'$  (165.1 cm) ?Weight: 58.5 kg (128 lb 15.5 oz) ?IBW/kg (Calculated) : 57 ? ?Labs: ?Recent Labs  ?  04/11/21 ?1505 04/12/21 ?0543 04/12/21 ?1003 04/13/21 ?0453  ?HGB 11.1*  11.0* 9.9*  --  9.9*  ?HCT 33.7*  33.5* 30.2*  --  29.7*  ?PLT 490*  454* 385  --  417*  ?APTT  --  56*  --   --   ?LABPROT  --  15.9*  --   --   ?INR  --  1.3*  --   --   ?CREATININE 0.91 0.64  --  0.70  ?TROPONINIHS  --   --  6*  --   ? ? ?Estimated Creatinine Clearance: 48.8 mL/min (by C-G formula based on SCr of 0.7 mg/dL). ? ? ?Medical History: ?Past Medical History:  ?Diagnosis Date  ? Cancer [facility])   ? skin  ? ? ?Medications:  ?No anticoagulation prior to admission per my chart review ? ?Assessment: ?Patient is a 81 y/o F with medical history including hypothyroidism, GERD who presented to the ED from [facility] for shortness of breath, RLQ pain, and weakness and was ultimately admitted for new-onset Afib with RVR, bacterial pneumonia c/b loculated pleural effusion, and acute CHF. Pharmacy consulted for apixaban dosing for Afib. ? ?Plan:  ?--Start apixaban 2.5 mg BID (age > 80, Wt < 60 kg) ?--CBC at least every 3 days per protocol ? ?Benita Gutter ?04/13/2021,11:17 AM ? ? ?

## 2021-04-13 NOTE — Progress Notes (Signed)
OT Cancellation Note ? ?Patient Details ?Name: Katherine Bradshaw ?MRN: [id number] ?DOB: 1940/02/05 ? ? ?Cancelled Treatment:    Reason Eval/Treat Not Completed: OT screened, no needs identified, will sign off. OT order received. Chart reviewed. Pt seen in room, denies any strength, sensory, or functional deficits at this time. Endorses she is eager to return home to her regular routines with no safety concerns at this time. She feels back to her baseline level of functional independence with ADL management. No skilled OT needs identified. Will sign off at this time. Please re-consult if additional OT needs arise during this hospital stay.  ? ?Shara Blazing, M.S., OTR/L ?[facility] ?Ascom: [phone] ?04/13/21, 2:07 PM ? ? ?

## 2021-04-13 NOTE — Assessment & Plan Note (Addendum)
Echocardiogram showed normal ejection fraction, elevated BNP probably from volume.  She received IV Lasix. No need for additional Lasix

## 2021-04-13 NOTE — Progress Notes (Signed)
?Progress Note ? ? ?Patient: Katherine Bradshaw EBR:[id number] DOB: [birth year] DOA: 04/11/2021     1 ?DOS: the patient was seen and examined on 04/13/2021 ?  ?Brief hospital course: ?Ms. Noland Fordyce Deakins is a 81 year old female with medical history of hypothyroid, GERD, who presents to the emergency department from [facility] clinic for chief concerns of shortness of breath, right lower quadrant pain, weakness. ?Patient had shingles about a month ago, had upper respiratory infection for the last week.  Started have a cough and shortness of breath.  Also had right lower quadrant abdominal pain, which he has resolved after 2 episode of diarrhea at early morning of 3/10. ?Upon arriving the hospital, she had severe leukocytosis with white cell count 72.2, reactive thrombocytosis, lactic acidosis of 2.7.  Sodium 123.  Her vital signs showed atrial fibrillation with RVR 126, tachypnea.  She was placed on broad-spectrum antibiotics for severe sepsis.  Her CT scan of abdomen/pelvis did not show any acute changes.  Lower chest had a small loculated pleural effusion.  However on chest x-ray, there is very small amount of pleural effusion. ?Thoracentesis was performed on 3/10, removed 50 mL of fluids.  LDH 30/85, neutrophil 90% with total white cell 5842. ? ?Assessment and Plan: ?* New onset atrial fibrillation ([place]) ?Patient converted to atrial fibrillation again earlier this morning, heart rate with 130s, given metoprolol 5 mg as needed.  Also started diltiazem and 120 mg twice a day.  CHADS Score of 4, will also start Eliquis. ?Current atrial fibrillation may be partially triggered by diuretics.  Patient no longer has any volume overload, will give 500 mL of normal saline. ? ? ?Iron deficiency anemia ?Repleted with oral iron. ? ?Acute on chronic diastolic CHF (congestive heart failure) ([place]) ?Echocardiogram showed normal ejection fraction, elevated BNP probably from volume.  She received IV Lasix, condition much improved.  Discontinue  IV Lasix. ? ?Reactive thrombocytosis ?Improved. ? ?Secondary bacterial pneumonia ?Patient had a recent viral infection, followed by cough and shortness of breath.  Procalcitonin level elevated.  Reviewed chest x-ray, most likely has bilateral lower lobe pneumonia. ?This is a postviral bacterial pneumonia.   ?Patient also has significant parapneumonic effusion, removed 50 mL of fluids. ?We will continue antibiotics with Rocephin and Flagyl.  There is no concern for atypical bacterial infection. ? ?Severe sepsis ([place]) ?Patient met severe sepsis criteria with severe leukocytosis, tachypnea and tachycardia.  Patient also has elevated lactic acid.  She received IV fluids, condition has improved. ?Blood culture negative in 24 hours. ? ?Hypothyroidism ?Continue Synthroid. ? ?Leukocytosis ?Patient has a leukemoid reaction with a severe leukocytosis.  Appreciate oncology consult, leukocytosis is drastically improving.  This appears to be secondary to infection. ? ?Loculated pleural effusion ?As above. ? ?Hyponatremia ?We will give another dose IV potassium today ? ?Parapneumonic effusion ?Based on lab results, this definitely is a parapneumonic effusion.  But the amount of pleural effusion is small.  Patient will be seen by pulmonology. ? ? ?Acute CHF ([facility])-resolved as of 04/13/2021 ?Patient condition is consistent with new onset congestive heart failure. ?She had a recent viral infection, consider the possibility of myocarditis versus other cause of congestive heart failure. ?Reviewed the EKG, showed atrial fibrillation with T wave changes. ?Check a troponin and echocardiogram. ?Patient is hemodynamically stable now, will start IV Lasix at 40 mg daily.  Monitor electrolytes. ? ? ? ? ?  ? ?Subjective:  ?Patient developed atrial fibrillation with rapid ventricular response this morning again.  Her short of  breath is better.  She has no cough. ?No abdominal pain or nausea vomiting. ? ?Physical Exam: ?Vitals:  ? 04/13/21 0023  04/13/21 0319 04/13/21 0743 04/13/21 1120  ?BP: (!) 137/59 130/63 (!) 133/55 (!) 112/49  ?Pulse: 90 66 81 67  ?Resp:  _0 ?Temp: 98.3 ?F (36.8 ?C) 98.2 ?F (36.8 ?C) 97.8 ?F (36.6 ?C) 97.7 ?F (36.5 ?C)  ?TempSrc: Oral Axillary    ?SpO2: 93% 94% 95% 99%  ?Weight:      ?Height:      ? ?General exam: Appears calm and comfortable  ?Respiratory system: Clear to auscultation. Respiratory effort normal. ?Cardiovascular system: Irregularly irregular, tachycardic. No JVD, murmurs, rubs, gallops or clicks. No pedal edema. ?Gastrointestinal system: Abdomen is nondistended, soft and nontender. No organomegaly or masses felt. Normal bowel sounds heard. ?Central nervous system: Alert and oriented. No focal neurological deficits. ?Extremities: Symmetric 5 x 5 power. ?Skin: No rashes, lesions or ulcers ?Psychiatry: Judgement and insight appear normal. Mood & affect appropriate.  ? ?Data Reviewed: ?All lab results reviewed. ?Family Communication: Son updated ? ?Disposition: ?Status is: Inpatient ?Remains inpatient appropriate because: Severity of disease and IV treatment. ? Planned Discharge Destination: Home with Home Health ? ? ? ?Time spent: 39 minutes ? ?Author: ?Sharen Hones, MD ?04/13/2021 12:26 PM ? ?For on call review [email or web address]  ?

## 2021-04-13 NOTE — Assessment & Plan Note (Addendum)
Continue oral iron 

## 2021-04-13 NOTE — Consult Note (Signed)
PULMONOLOGY         Date: 04/13/2021,   MRN# [id number] Katherine Bradshaw 05/18/1940     AdmissionWeight: 57.2 kg                 CurrentWeight: 58.5 kg   Referring physician: Dr Roosevelt Locks   CHIEF COMPLAINT:   Secondary bacterial pneumonia with bilateral effusions   HISTORY OF PRESENT ILLNESS   81 year old female with a history of GERD and hypothyroidism who came from [facility] for dyspnea on exertion and shortness of breath at rest right-sided chest discomfort.  Presented to the ED with tachycardia mild hypotension and mild anemia.  Head CT imaging performed with pleural effusion noted.  In the ED developed atrial fibrillation with rapid ventricular response and received IV therapy including metoprolol and amiodarone with subsequent improvement.  Patient was seen by cardiology with additional medications delivered for arrhythmia.  Interventional radiology consultation placed for thoracentesis with pleural fluid studies ordered.  Empirically treated for pneumonia with vancomycin and cefepime as well as Flagyl received resuscitative fluids with LR.  Oncology consultation for severe leucocytosis.   Patient shares since March 5th on Sunday she developed sore throat and cough and then she got myalgia and malaise. She is has never smoked. She had sister with breast cancer who passed away and anther sister with breast cancer who survived. She reports severe fatigue for at least 5 years progressively worsening.    PAST MEDICAL HISTORY   Past Medical History:  Diagnosis Date   Cancer ([place])    skin     SURGICAL HISTORY   Past Surgical History:  Procedure Laterality Date   BREAST BIOPSY Bilateral    neg     FAMILY HISTORY   Family History  Problem Relation Age of Onset   Breast cancer Sister 12     SOCIAL HISTORY   Social History   Tobacco Use   Smoking status: Never   Smokeless tobacco: Never  Substance Use Topics   Alcohol use: Not Currently   Drug use: Never      MEDICATIONS    Home Medication:    Current Medication:  Current Facility-Administered Medications:    acetaminophen (TYLENOL) tablet 650 mg, 650 mg, Oral, Q6H PRN **OR** acetaminophen (TYLENOL) suppository 650 mg, 650 mg, Rectal, Q6H PRN, Cox, Amy N, DO   amiodarone (PACERONE) tablet 200 mg, 200 mg, Oral, BID, Sharen Hones, MD, 200 mg at 04/13/21 5852   apixaban (ELIQUIS) tablet 2.5 mg, 2.5 mg, Oral, BID, Benita Gutter, RPH   [START ON 04/14/2021] cefTRIAXone (ROCEPHIN) 2 g in sodium chloride 0.9 % 100 mL IVPB, 2 g, Intravenous, Q24H, Zhang, Dekui, MD   diltiazem (CARDIZEM SR) 12 hr capsule 120 mg, 120 mg, Oral, Q12H, Sharen Hones, MD, 120 mg at 04/13/21 1019   iron polysaccharides (NIFEREX) capsule 150 mg, 150 mg, Oral, Daily, Roosevelt Locks, Dekui, MD   latanoprost (XALATAN) 0.005 % ophthalmic solution 1 drop, 1 drop, Both Eyes, QHS, Cox, Amy N, DO, 1 drop at 04/12/21 2126   levothyroxine (SYNTHROID) tablet 75 mcg, 75 mcg, Oral, Q0600, Cox, Amy N, DO, 75 mcg at 04/13/21 0552   metoprolol tartrate (LOPRESSOR) injection 5 mg, 5 mg, Intravenous, Q6H PRN, Sharen Hones, MD, 5 mg at 04/13/21 0941   metroNIDAZOLE (FLAGYL) tablet 500 mg, 500 mg, Oral, Q12H, Sharen Hones, MD, 500 mg at 04/13/21 7782   multivitamin with minerals tablet 1 tablet, 1 tablet, Oral, Daily, Cox, Amy N, DO, 1 tablet at 04/11/21  2203   ondansetron (ZOFRAN) tablet 4 mg, 4 mg, Oral, Q6H PRN **OR** ondansetron (ZOFRAN) injection 4 mg, 4 mg, Intravenous, Q6H PRN, Cox, Amy N, DO   pantoprazole (PROTONIX) EC tablet 40 mg, 40 mg, Oral, Daily, Cox, Amy N, DO, 40 mg at 04/13/21 8675   vitamin B-12 (CYANOCOBALAMIN) tablet 1,000 mcg, 1,000 mcg, Oral, Daily, Cox, Amy N, DO, 1,000 mcg at 04/11/21 2203    ALLERGIES   Patient has no known allergies.     REVIEW OF SYSTEMS    Review of Systems:  Gen:  Denies  fever, sweats, chills weigh loss  HEENT: Denies blurred vision, double vision, ear pain, eye pain, hearing loss, nose  bleeds, sore throat Cardiac:  No dizziness, chest pain or heaviness, chest tightness,edema Resp:   Denies cough or sputum porduction, shortness of breath,wheezing, hemoptysis,  Gi: Denies swallowing difficulty, stomach pain, nausea or vomiting, diarrhea, constipation, bowel incontinence Gu:  Denies bladder incontinence, burning urine Ext:   Denies Joint pain, stiffness or swelling Skin: Denies  skin rash, easy bruising or bleeding or hives Endoc:  Denies polyuria, polydipsia , polyphagia or weight change Psych:   Denies depression, insomnia or hallucinations   Other:  All other systems negative   VS: BP (!) 112/49 (BP Location: Right Arm)    Pulse 67    Temp 97.7 F (36.5 C)    Resp 17    Ht '5\' 5"'  (1.651 m)    Wt 58.5 kg    SpO2 99%    BMI 21.46 kg/m      PHYSICAL EXAM    GENERAL:NAD, no fevers, chills, no weakness no fatigue HEAD: Normocephalic, atraumatic.  EYES: Pupils equal, round, reactive to light. Extraocular muscles intact. No scleral icterus.  MOUTH: Moist mucosal membrane. Dentition intact. No abscess noted.  EAR, NOSE, THROAT: Clear without exudates. No external lesions.  NECK: Supple. No thyromegaly. No nodules. No JVD.  PULMONARY: mild rhonchi  CARDIOVASCULAR: S1 and S2. Regular rate and rhythm. No murmurs, rubs, or gallops. No edema. Pedal pulses 2+ bilaterally.  GASTROINTESTINAL: Soft, nontender, nondistended. No masses. Positive bowel sounds. No hepatosplenomegaly.  MUSCULOSKELETAL: No swelling, clubbing, or edema. Range of motion full in all extremities.  NEUROLOGIC: Cranial nerves II through XII are intact. No gross focal neurological deficits. Sensation intact. Reflexes intact.  SKIN: No ulceration, lesions, rashes, or cyanosis. Skin warm and dry. Turgor intact.  PSYCHIATRIC: Mood, affect within normal limits. The patient is awake, alert and oriented x 3. Insight, judgment intact.       IMAGING    CT ABDOMEN PELVIS W CONTRAST  Result Date:  04/11/2021 CLINICAL DATA:  Right-sided abdominal pain EXAM: CT ABDOMEN AND PELVIS WITH CONTRAST TECHNIQUE: Multidetector CT imaging of the abdomen and pelvis was performed using the standard protocol following bolus administration of intravenous contrast. RADIATION DOSE REDUCTION: This exam was performed according to the departmental dose-optimization program which includes automated exposure control, adjustment of the mA and/or kV according to patient size and/or use of iterative reconstruction technique. CONTRAST:  131m OMNIPAQUE IOHEXOL 300 MG/ML  SOLN COMPARISON:  None. FINDINGS: Lower chest: Loculated appearing small right pleural effusion is partially imaged. There is adjacent atelectasis. Hepatobiliary: No focal liver abnormality is seen. No gallstones, gallbladder wall thickening, or biliary dilatation. Pancreas: Unremarkable. No pancreatic ductal dilatation or surrounding inflammatory changes. Spleen: Normal in size without focal abnormality. Adrenals/Urinary Tract: Adrenals are unremarkable. Too small to characterize low-density lesions of the right kidney. Bladder is unremarkable. Stomach/Bowel: Stomach is within normal  limits. Bowel is normal in caliber. Sigmoid diverticulosis. Vascular/Lymphatic: Atherosclerosis.  No enlarged nodes. Reproductive: Status post hysterectomy. No adnexal masses. Other: No free fluid.  No acute abnormality of the abdominal wall. Musculoskeletal: No acute osseous abnormality. IMPRESSION: Partially imaged loculated appearing small right pleural effusion. No acute abnormality in the abdomen or pelvis. Electronically Signed   By: Macy Mis M.D.   On: 04/11/2021 13:05   DG Chest Port 1 View  Result Date: 04/12/2021 CLINICAL DATA:  Post thoracentesis. EXAM: PORTABLE CHEST 1 VIEW COMPARISON:  Chest XR, 04/11/2021.  CT chest, 03/05/2011. FINDINGS: Cardiomediastinal silhouette is unchanged. LEFT lung is are well inflated. Interval resolution of small volume RIGHT pleural  effusion, with trace residual. Residual RIGHT basilar hazy opacity, likely atelectasis. Mild blunting of the LEFT costophrenic angle. No pneumothorax. No acute osseous abnormality. IMPRESSION: 1. Interval resolution of small volume RIGHT pleural effusion, with trace residual. 2. No postprocedure pneumothorax. Electronically Signed   By: Michaelle Birks M.D.   On: 04/12/2021 15:18   DG Chest Port 1 View  Result Date: 04/11/2021 CLINICAL DATA:  Evaluate pleural effusion seen on CT. EXAM: PORTABLE CHEST 1 VIEW COMPARISON:  None. FINDINGS: Mild to moderate severity diffuse, chronic appearing increased interstitial lung markings are seen. Mild areas of atelectasis are noted within the bilateral lung bases. There are very small bilateral pleural effusions. No pneumothorax is identified. The heart size and mediastinal contours are within normal limits. There is marked severity calcification of the aortic arch. The visualized skeletal structures are unremarkable. IMPRESSION: 1. Chronic appearing increased interstitial lung markings. Mild, superimposed component of interstitial edema cannot be excluded. 2. Mild bibasilar atelectasis. 3. Very small bilateral pleural effusions. Electronically Signed   By: Virgina Norfolk M.D.   On: 04/11/2021 18:59   ECHOCARDIOGRAM COMPLETE  Result Date: 04/12/2021    ECHOCARDIOGRAM REPORT   Patient Name:   Katherine Bradshaw Date of Exam: 04/12/2021 Medical Rec #:  [id number]       Height:       65.0 in Accession #:    [id number]      Weight:       129.0 lb Date of Birth:  1940/06/10       BSA:          1.642 m Patient Age:    45 years        BP:           146/57 mmHg Patient Gender: F               HR:           66 bpm. Exam Location:  [facility] Procedure: 2D Echo, Color Doppler and Cardiac Doppler Indications:     I48.91 Atrial fibrillation  History:         Patient has no prior history of Echocardiogram examinations.                  Signs/Symptoms:Shortness of Breath and Weakness.   Sonographer:     Charmayne Sheer Referring Phys:  [id number] AMY N COX Diagnosing Phys: Ida Rogue MD IMPRESSIONS  1. Left ventricular ejection fraction, by estimation, is 60 to 65%. The left ventricle has normal function. The left ventricle has no regional wall motion abnormalities. Left ventricular diastolic parameters are consistent with Grade I diastolic dysfunction (impaired relaxation).  2. Right ventricular systolic function is normal. The right ventricular size is normal.  3. The mitral valve is normal in structure. Moderate mitral valve regurgitation. No evidence  of mitral stenosis. There is mild late systolic prolapse of multiple scallops of the posterior leaflet of the mitral valve.  4. The aortic valve is tricuspid. Aortic valve regurgitation is not visualized. No aortic stenosis is present.  5. The inferior vena cava is normal in size with greater than 50% respiratory variability, suggesting right atrial pressure of 3 mmHg. FINDINGS  Left Ventricle: Left ventricular ejection fraction, by estimation, is 60 to 65%. The left ventricle has normal function. The left ventricle has no regional wall motion abnormalities. The left ventricular internal cavity size was normal in size. There is  no left ventricular hypertrophy. Left ventricular diastolic parameters are consistent with Grade I diastolic dysfunction (impaired relaxation). Right Ventricle: The right ventricular size is normal. No increase in right ventricular wall thickness. Right ventricular systolic function is normal. Left Atrium: Left atrial size was normal in size. Right Atrium: Right atrial size was normal in size. Pericardium: There is no evidence of pericardial effusion. Mitral Valve: The mitral valve is normal in structure. There is mild late systolic prolapse of multiple scallops of the posterior leaflet of the mitral valve. Moderate mitral valve regurgitation. No evidence of mitral valve stenosis. MV peak gradient, 7.0 mmHg. The mean mitral  valve gradient is 2.5 mmHg. Tricuspid Valve: The tricuspid valve is normal in structure. Tricuspid valve regurgitation is mild . No evidence of tricuspid stenosis. Aortic Valve: The aortic valve is tricuspid. Aortic valve regurgitation is not visualized. No aortic stenosis is present. Aortic valve mean gradient measures 4.0 mmHg. Aortic valve peak gradient measures 7.2 mmHg. Aortic valve area, by VTI measures 2.90 cm. Pulmonic Valve: The pulmonic valve was normal in structure. Pulmonic valve regurgitation is trivial. No evidence of pulmonic stenosis. Aorta: The aortic root is normal in size and structure. Venous: The inferior vena cava is normal in size with greater than 50% respiratory variability, suggesting right atrial pressure of 3 mmHg. IAS/Shunts: No atrial level shunt detected by color flow Doppler.  LEFT VENTRICLE PLAX 2D LVIDd:         3.71 cm   Diastology LVIDs:         2.32 cm   LV e' lateral:   8.86 cm/s LV PW:         1.03 cm   LV E/e' lateral: 14.9 LV IVS:        0.84 cm LVOT diam:     2.00 cm LV SV:         63 LV SV Index:   38 LVOT Area:     3.14 cm  RIGHT VENTRICLE RV Basal diam:  2.54 cm LEFT ATRIUM           Index        RIGHT ATRIUM          Index LA diam:      3.10 cm 1.89 cm/m   RA Area:     8.89 cm LA Vol (A2C): 23.5 ml 14.32 ml/m  RA Volume:   15.40 ml 9.38 ml/m LA Vol (A4C): 19.5 ml 11.88 ml/m  AORTIC VALVE                    PULMONIC VALVE AV Area (Vmax):    2.77 cm     PV Vmax:          0.88 m/s AV Area (Vmean):   2.57 cm     PV Vmean:         59.800 cm/s AV Area (VTI):  2.90 cm     PV VTI:           0.156 m AV Vmax:           134.00 cm/s  PV Peak grad:     3.1 mmHg AV Vmean:          87.200 cm/s  PV Mean grad:     2.0 mmHg AV VTI:            0.218 m      PR End Diast Vel: 4.67 msec AV Peak Grad:      7.2 mmHg AV Mean Grad:      4.0 mmHg LVOT Vmax:         118.00 cm/s LVOT Vmean:        71.300 cm/s LVOT VTI:          0.201 m LVOT/AV VTI ratio: 0.92  AORTA Ao Root diam: 3.20  cm MITRAL VALVE MV Area (PHT): 4.96 cm     SHUNTS MV Area VTI:   2.08 cm     Systemic VTI:  0.20 m MV Peak grad:  7.0 mmHg     Systemic Diam: 2.00 cm MV Mean grad:  2.5 mmHg MV Vmax:       1.32 m/s MV Vmean:      74.8 cm/s MV Decel Time: 153 msec MV E velocity: 131.67 cm/s MV A velocity: 92.45 cm/s MV E/A ratio:  1.42 Ida Rogue MD Electronically signed by Ida Rogue MD Signature Date/Time: 04/12/2021/12:50:34 PM    Final    US THORACENTESIS ASP PLEURAL SPACE W/IMG GUIDE  Result Date: 04/12/2021 INDICATION: Patient increasing shortness of breath found to have AFib and bacterial pneumonia with a right sided pleural effusion. Request is for therapeutic and diagnostic right-sided thoracentesis EXAM: ULTRASOUND GUIDED DIAGNOSTIC AND THERAPEUTIC RIGHT-SIDED THORACENTESIS MEDICATIONS: Lidocaine 1% 10 mL COMPLICATIONS: None immediate. PROCEDURE: An ultrasound guided thoracentesis was thoroughly discussed with the patient and questions answered. The benefits, risks, alternatives and complications were also discussed. The patient understands and wishes to proceed with the procedure. Written consent was obtained. Ultrasound was performed to localize and mark an adequate pocket of fluid in the right chest. The area was then prepped and draped in the normal sterile fashion. 1% Lidocaine was used for local anesthesia. Under ultrasound guidance a 6 Fr Safe-T-Centesis catheter was introduced. Thoracentesis was performed. The catheter was removed and a dressing applied. FINDINGS: A total of approximately 50 mL of amber colored fluid was removed. Samples were sent to the laboratory as requested by the clinical team. A second pocket loculated phlegmon with noted to be in the right bases. Area in question too thick for thoracentesis. IMPRESSION: Successful ultrasound guided diagnostic and therapeutic right-sided thoracentesis yielding 50 mL of pleural fluid. Procedure performed and read by: Rushie Nyhan, IR NP  Electronically Signed   By: Michaelle Birks M.D.   On: 04/12/2021 16:03    Component Ref Range & Units 1 d ago  Fluid Type-FCT  CYTO PLEU   Color, Fluid YELLOW ORANGE Abnormal    Appearance, Fluid CLEAR CLOUDY Abnormal    Total Nucleated Cell Count, Fluid cu mm 5,842   Neutrophil Count, Fluid % 90   Lymphs, Fluid % 5   Monocyte-Macrophage-Serous Fluid % 5   Eos, Fluid % 0   Comment: Performed at [facility], [address] [place], [place] [address]  Resulting Agency  [facility]   FINDINGS: A total of approximately 50 mL of amber  colored fluid was removed. Samples were sent to the laboratory as requested by the clinical team. A second pocket loculated phlegmon with noted to be in the right bases. Area in question too thick for thoracentesis.   IMPRESSION: Successful ultrasound guided diagnostic and therapeutic right-sided thoracentesis yielding 50 mL of pleural fluid.   Procedure performed and read by: Rushie Nyhan, IR NP    ASSESSMENT/PLAN    Severe community acquired pneumonia - present on admission  - COVID19 negative   - supplemental O2 during my evaluation -room air  -serum fungitell -legionella ab -strep pneumoniae ur AG -Histoplasma Ur Ag -sputum resp cultures -no need for AFB sputum expectorated specimen -sputum cytology  -reviewed pertinent imaging with patient today - ESR/CRP ANA comprenesisve Procalcitonin trend -MRSA negative  -please encourage patient to use incentive spirometer few times each hour while hospitalized.   -s/p VZV infection    Right parapneumonic effusion -   patient is improved on room air in no distress and does have chest discomfort   - PMN predominant exudative effusion with negative gram stain and culture thus far   -peripheral blood film with leukemoid reaction suggestive of infection -if needed patient is agreeable to chest tube   Atrial fibrillation with RVR    - Resolved   -s/p cardiology evaluation      Thank you for allowing me to participate in the care of this patient.   -patient has at least one life threatening severe medical condition which is being actively treated and managed during this evaluation.    Patient/Family are satisfied with care plan and all questions have been answered.  This document was prepared using Dragon voice recognition software and may include unintentional dictation errors.     Ottie Glazier, M.D.  Division of [facility]

## 2021-04-13 NOTE — Evaluation (Signed)
Physical Therapy Evaluation ?Patient Details ?Name: Katherine Bradshaw ?MRN: [id number] ?DOB: Feb 04, 1940 ?Today's Date: 04/13/2021 ? ?History of Present Illness ? Patient is an 81 year old female with a PMH (+) for  hypothyroidism, and GERD. Patient presented to ED for SOB, and was admitted for new onset of atrial fibrillation. ?  ?Clinical Impression ? Physical Therapy Evaluation completed on this date. Patient was agreeable to treatment and reported no pain. Per patient, she lives in a 1 story home by herself with 2 steps to enter and no hand rails. Patient ambulates with a R quad cane at baseline, and is Mod I/ Independent with all mobility and ADLs. SpO2 ranged from 91-93% on room air throughout session.  ? ?Patient demonstrated generalized weakness in BLEs at 3+/5 hip flexion and 5/5 DF strength. Patient was able to complete at bed mobility and sit to stand transfers (with R quad cane) at Mod I, with only increased effort with supine to sit transfer. She was able to tolerate 1 lap around the nurses station with her R quad cane at supervision, no standing rest breaks due to fatigue, and no LOB. Patient demonstrated increased assistance with ascending and descending 2 steps with R quad cane and intermittent tactile touch from HR at CGA-SBA. Patient would ascend with a step through pattern, however descend steps at a step to pattern. Mild unsteadiness noted, however patient did report stairwell steps were larger than steps at home. Patient is demonstrating at/near baseline level of function. Do not recommend additional skilled physical therapy at this time upon discharge from acute hospitalization. Will continue to see during acute stay to prevent decline in function.  ?   ? ?Recommendations for follow up therapy are one component of a multi-disciplinary discharge planning process, led by the attending physician.  Recommendations may be updated based on patient status, additional functional criteria and insurance  authorization. ? ?Follow Up Recommendations No PT follow up ? ?  ?Assistance Recommended at Discharge PRN  ?Patient can return home with the following ? Help with stairs or ramp for entrance ? ?  ?Equipment Recommendations None recommended by PT (at this time)  ?Recommendations for Other Services ?    ?  ?Functional Status Assessment Patient has had a recent decline in their functional status and demonstrates the ability to make significant improvements in function in a reasonable and predictable amount of time.  ? ?  ?Precautions / Restrictions Precautions ?Precautions: Fall ?Restrictions ?Weight Bearing Restrictions: No  ? ?  ? ?Mobility ? Bed Mobility ?Overal bed mobility: Modified Independent ?  ?  ?  ?  ?  ?  ?General bed mobility comments: increased effort ?Patient Response: Cooperative ? ?Transfers ?Overall transfer level: Modified independent ?Equipment used: Quad cane (R) ?  ?  ?  ?  ?  ?  ?  ?  ?  ? ?Ambulation/Gait ?Ambulation/Gait assistance: Modified independent (Device/Increase time) ?Gait Distance (Feet): 220 Feet ?Assistive device: Quad cane (R) ?Gait Pattern/deviations: Step-through pattern, Narrow base of support ?  ?Gait velocity interpretation: >2.62 ft/sec, indicative of community ambulatory ?  ?General Gait Details: no LOB noted ? ?Stairs ?Stairs: Yes ?Stairs assistance: Min guard (SBA) ?Stair Management: With cane, Step to pattern, Alternating pattern ?Number of Stairs: 2 ?General stair comments: intermittently utilized HR through tactile touch for stability, however patient reported stairwell steps were taller than steps to get into/out of home, Alternting pattern to ascend, step to pattern to descend ? ?Wheelchair Mobility ?  ? ?Modified Rankin (Stroke Patients  Only) ?  ? ?  ? ?Balance Overall balance assessment: Needs assistance ?Sitting-balance support: No upper extremity supported, Feet supported ?Sitting balance-Leahy Scale: Normal ?  ?  ?Standing balance support: Single extremity  supported, During functional activity ?Standing balance-Leahy Scale: Good ?  ?  ?  ?  ?  ?  ?  ?  ?  ?  ?  ?  ?   ? ? ? ?Pertinent Vitals/Pain Pain Assessment ?Pain Assessment: No/denies pain  ? ? ?Home Living Family/patient expects to be discharged to:: Private residence ?Living Arrangements: Alone ?Available Help at Discharge: Family (Son) ?Type of Home: House ?Home Access: Stairs to enter ?Entrance Stairs-Rails: None ?Entrance Stairs-Number of Steps: 2 ?  ?Home Layout: One level ?Home Equipment: Cane - quad ?   ?  ?Prior Function Prior Level of Function : Independent/Modified Independent ?  ?  ?  ?  ?  ?  ?  ?  ?  ? ? ?Hand Dominance  ? Dominant Hand: Right ? ?  ?Extremity/Trunk Assessment  ? Upper Extremity Assessment ?Upper Extremity Assessment: Generalized weakness (at least 4-/5 strength- patient reports this is her baseline) ?  ? ?Lower Extremity Assessment ?Lower Extremity Assessment: Generalized weakness (at least 3+/5 strength for hip flexion, 5/5 DF strength bilaterally) ?  ? ?   ?Communication  ? Communication: No difficulties  ?Cognition   ?Behavior During Therapy: [facility] for tasks assessed/performed ?Overall Cognitive Status: Within Functional Limits for tasks assessed ?  ?  ?  ?  ?  ?  ?  ?  ?  ?  ?  ?  ?  ?  ?  ?  ?General Comments: A&Ox3-self, location, situation ?  ?  ? ?  ?General Comments General comments (skin integrity, edema, etc.): SpO2 ranged from 91-93% on room air, HR ranged from 77-84bpm throughout session ? ?  ?Exercises Other Exercises ?Other Exercises: Patient educated on role of PT in acute setting, fall risk, and d/c plans  ? ?Assessment/Plan  ?  ?PT Assessment Patient needs continued PT services  ?PT Problem List Decreased strength;Decreased balance;Decreased activity tolerance ? ?   ?  ?PT Treatment Interventions Stair training;Balance training;Therapeutic activities;Therapeutic exercise   ? ?PT Goals (Current goals can be found in the Care Plan section)  ?Acute Rehab PT  Goals ?Patient Stated Goal: to go home ?PT Goal Formulation: With patient ?Time For Goal Achievement: 04/27/21 ?Potential to Achieve Goals: Good ? ?  ?Frequency Min 2X/week ?  ? ? ?Co-evaluation   ?  ?  ?  ?  ? ? ?  ?AM-PAC PT "6 Clicks" Mobility  ?Outcome Measure Help needed turning from your back to your side while in a flat bed without using bedrails?: None ?Help needed moving from lying on your back to sitting on the side of a flat bed without using bedrails?: None ?Help needed moving to and from a bed to a chair (including a wheelchair)?: None ?Help needed standing up from a chair using your arms (e.g., wheelchair or bedside chair)?: None ?Help needed to walk in hospital room?: A Little ?Help needed climbing 3-5 steps with a railing? : A Little ?6 Click Score: 22 ? ?  ?End of Session Equipment Utilized During Treatment: Gait belt ?Activity Tolerance: Patient tolerated treatment well ?Patient left: in bed;with bed alarm set;with call bell/phone within reach ?Nurse Communication: Mobility status ?PT Visit Diagnosis: Muscle weakness (generalized) (M62.81);Unsteadiness on feet (R26.81) ?  ? ?Time: 5361-4431 ?PT Time Calculation (min) (ACUTE ONLY): 17 min ? ? ?  Charges:   PT Evaluation ?$PT Eval Low Complexity: 1 Low ?  ?  ?   ? ? ?Iva Boop, PT  ?04/13/21. 1:17 PM ? ? ?

## 2021-04-14 DIAGNOSIS — I4891 Unspecified atrial fibrillation: Secondary | ICD-10-CM | POA: Diagnosis not present

## 2021-04-14 DIAGNOSIS — I5033 Acute on chronic diastolic (congestive) heart failure: Secondary | ICD-10-CM | POA: Diagnosis not present

## 2021-04-14 DIAGNOSIS — J159 Unspecified bacterial pneumonia: Secondary | ICD-10-CM | POA: Diagnosis not present

## 2021-04-14 DIAGNOSIS — J189 Pneumonia, unspecified organism: Secondary | ICD-10-CM | POA: Diagnosis not present

## 2021-04-14 LAB — CBC WITH DIFFERENTIAL/PLATELET
Abs Immature Granulocytes: 1.19 10*3/uL — ABNORMAL HIGH (ref 0.00–0.07)
Basophils Absolute: 0.2 10*3/uL — ABNORMAL HIGH (ref 0.0–0.1)
Basophils Relative: 1 %
Eosinophils Absolute: 0.2 10*3/uL (ref 0.0–0.5)
Eosinophils Relative: 1 %
HCT: 30.1 % — ABNORMAL LOW (ref 36.0–46.0)
Hemoglobin: 10 g/dL — ABNORMAL LOW (ref 12.0–15.0)
Immature Granulocytes: 5 %
Lymphocytes Relative: 12 %
Lymphs Abs: 3 10*3/uL (ref 0.7–4.0)
MCH: 28.2 pg (ref 26.0–34.0)
MCHC: 33.2 g/dL (ref 30.0–36.0)
MCV: 85 fL (ref 80.0–100.0)
Monocytes Absolute: 2 10*3/uL — ABNORMAL HIGH (ref 0.1–1.0)
Monocytes Relative: 8 %
Neutro Abs: 19.1 10*3/uL — ABNORMAL HIGH (ref 1.7–7.7)
Neutrophils Relative %: 73 %
Platelets: 463 10*3/uL — ABNORMAL HIGH (ref 150–400)
RBC: 3.54 MIL/uL — ABNORMAL LOW (ref 3.87–5.11)
RDW: 13.4 % (ref 11.5–15.5)
Smear Review: NORMAL
WBC: 25.7 10*3/uL — ABNORMAL HIGH (ref 4.0–10.5)
nRBC: 0 % (ref 0.0–0.2)

## 2021-04-14 LAB — BASIC METABOLIC PANEL
Anion gap: 6 (ref 5–15)
BUN: 12 mg/dL (ref 8–23)
CO2: 29 mmol/L (ref 22–32)
Calcium: 8 mg/dL — ABNORMAL LOW (ref 8.9–10.3)
Chloride: 100 mmol/L (ref 98–111)
Creatinine, Ser: 0.71 mg/dL (ref 0.44–1.00)
GFR, Estimated: >60 mL/min (ref >60)
Glucose, Bld: 110 mg/dL — ABNORMAL HIGH (ref 70–99)
Potassium: 3.3 mmol/L — ABNORMAL LOW (ref 3.5–5.1)
Sodium: 135 mmol/L (ref 135–145)

## 2021-04-14 LAB — STREP PNEUMONIAE URINARY ANTIGEN: Strep Pneumo Urinary Antigen: NEGATIVE

## 2021-04-14 LAB — ANTISTREPTOLYSIN O TITER: ASO: 1160 IU/mL — ABNORMAL HIGH (ref 0.0–200.0)

## 2021-04-14 LAB — HAPTOGLOBIN: Haptoglobin: 449 mg/dL — ABNORMAL HIGH (ref 41–333)

## 2021-04-14 LAB — MAGNESIUM: Magnesium: 2.4 mg/dL (ref 1.7–2.4)

## 2021-04-14 MED ORDER — LOPERAMIDE HCL 2 MG PO CAPS: 2.0000 mg | ORAL_CAPSULE | ORAL | Status: DC | PRN

## 2021-04-14 MED ORDER — SODIUM CHLORIDE 0.9 % IV SOLN: INTRAVENOUS | Status: DC | PRN

## 2021-04-14 MED ORDER — MELATONIN 5 MG PO TABS
5.0000 mg | ORAL_TABLET | Freq: Every evening | ORAL | Status: DC | PRN
  Administered 2021-04-14 – 2021-04-17 (×3): 5 mg via ORAL
  Filled 2021-04-14 (×3): qty 1

## 2021-04-14 MED ORDER — POTASSIUM CHLORIDE CRYS ER 20 MEQ PO TBCR
40.0000 meq | EXTENDED_RELEASE_TABLET | Freq: Two times a day (BID) | ORAL | Status: AC
  Administered 2021-04-14 (×2): 40 meq via ORAL
  Filled 2021-04-14 (×2): qty 2

## 2021-04-14 MED ORDER — POTASSIUM CHLORIDE 10 MEQ/100ML IV SOLN
10.0000 meq | INTRAVENOUS | Status: DC
  Administered 2021-04-14: 10 meq via INTRAVENOUS
  Filled 2021-04-14 (×4): qty 100

## 2021-04-14 MED ORDER — SODIUM CHLORIDE 0.9% FLUSH
3.0000 mL | Freq: Two times a day (BID) | INTRAVENOUS | Status: DC
  Administered 2021-04-14 – 2021-04-19 (×8): 3 mL via INTRAVENOUS

## 2021-04-14 MED ORDER — POTASSIUM CHLORIDE 10 MEQ/100ML IV SOLN
10.0000 meq | INTRAVENOUS | Status: AC
  Administered 2021-04-14: 10 meq via INTRAVENOUS
  Filled 2021-04-14 (×2): qty 100

## 2021-04-14 MED ORDER — SODIUM CHLORIDE 0.9 % IV SOLN
3.0000 g | Freq: Four times a day (QID) | INTRAVENOUS | Status: DC
  Administered 2021-04-14 – 2021-04-19 (×20): 3 g via INTRAVENOUS
  Filled 2021-04-14: qty 8
  Filled 2021-04-14: qty 3
  Filled 2021-04-14 (×2): qty 8
  Filled 2021-04-14: qty 3
  Filled 2021-04-14 (×2): qty 8
  Filled 2021-04-14: qty 3
  Filled 2021-04-14 (×3): qty 8
  Filled 2021-04-14: qty 3
  Filled 2021-04-14: qty 8
  Filled 2021-04-14: qty 3
  Filled 2021-04-14 (×4): qty 8
  Filled 2021-04-14 (×2): qty 3
  Filled 2021-04-14 (×4): qty 8

## 2021-04-14 NOTE — Progress Notes (Signed)
PULMONOLOGY         Date: 04/14/2021,   MRN# [id number] Katherine Bradshaw 08-20-1940     AdmissionWeight: 57.2 kg                 CurrentWeight: 58.5 kg   Referring physician: Dr Roosevelt Locks   CHIEF COMPLAINT:   Secondary bacterial pneumonia with bilateral effusions   HISTORY OF PRESENT ILLNESS   81 year old female with a history of GERD and hypothyroidism who came from [facility] for dyspnea on exertion and shortness of breath at rest right-sided chest discomfort.  Presented to the ED with tachycardia mild hypotension and mild anemia.  Head CT imaging performed with pleural effusion noted.  In the ED developed atrial fibrillation with rapid ventricular response and received IV therapy including metoprolol and amiodarone with subsequent improvement.  Patient was seen by cardiology with additional medications delivered for arrhythmia.  Interventional radiology consultation placed for thoracentesis with pleural fluid studies ordered.  Empirically treated for pneumonia with vancomycin and cefepime as well as Flagyl received resuscitative fluids with LR.  Oncology consultation for severe leucocytosis.   Patient shares since March 5th on Sunday she developed sore throat and cough and then she got myalgia and malaise. She is has never smoked. She had sister with breast cancer who passed away and anther sister with breast cancer who survived. She reports severe fatigue for at least 5 years progressively worsening.    04/14/21- patient is substantially improved and bloodwork is improved in paralell. Granddaughter [name] was present during interview and physical examination reports feeling well. She is on room air.   PAST MEDICAL HISTORY   Past Medical History:  Diagnosis Date   Cancer ([place])    skin     SURGICAL HISTORY   Past Surgical History:  Procedure Laterality Date   BREAST BIOPSY Bilateral    neg     FAMILY HISTORY   Family History  Problem Relation Age of Onset   Breast  cancer Sister 54     SOCIAL HISTORY   Social History   Tobacco Use   Smoking status: Never   Smokeless tobacco: Never  Substance Use Topics   Alcohol use: Not Currently   Drug use: Never     MEDICATIONS    Home Medication:    Current Medication:  Current Facility-Administered Medications:    acetaminophen (TYLENOL) tablet 650 mg, 650 mg, Oral, Q6H PRN **OR** acetaminophen (TYLENOL) suppository 650 mg, 650 mg, Rectal, Q6H PRN, Cox, Amy N, DO   amiodarone (PACERONE) tablet 200 mg, 200 mg, Oral, BID, Roosevelt Locks, Dekui, MD, 200 mg at 04/14/21 1020   Ampicillin-Sulbactam (UNASYN) 3 g in sodium chloride 0.9 % 100 mL IVPB, 3 g, Intravenous, Q6H, Zhang, Dekui, MD   apixaban (ELIQUIS) tablet 2.5 mg, 2.5 mg, Oral, BID, Benita Gutter, RPH, 2.5 mg at 04/14/21 1020   diltiazem (CARDIZEM SR) 12 hr capsule 120 mg, 120 mg, Oral, Q12H, Sharen Hones, MD, 120 mg at 04/14/21 1019   iron polysaccharides (NIFEREX) capsule 150 mg, 150 mg, Oral, Daily, Roosevelt Locks, Dekui, MD, 150 mg at 04/14/21 1019   latanoprost (XALATAN) 0.005 % ophthalmic solution 1 drop, 1 drop, Both Eyes, QHS, Cox, Amy N, DO, 1 drop at 04/13/21 2108   levothyroxine (SYNTHROID) tablet 75 mcg, 75 mcg, Oral, Q0600, Cox, Amy N, DO, 75 mcg at 04/14/21 1601   metoprolol tartrate (LOPRESSOR) injection 5 mg, 5 mg, Intravenous, Q6H PRN, Sharen Hones, MD, 5 mg at 04/13/21 [phone]  multivitamin with minerals tablet 1 tablet, 1 tablet, Oral, Daily, Cox, Amy N, DO, 1 tablet at 04/14/21 1020   ondansetron (ZOFRAN) tablet 4 mg, 4 mg, Oral, Q6H PRN **OR** ondansetron (ZOFRAN) injection 4 mg, 4 mg, Intravenous, Q6H PRN, Cox, Amy N, DO   pantoprazole (PROTONIX) EC tablet 40 mg, 40 mg, Oral, Daily, Cox, Amy N, DO, 40 mg at 04/14/21 1020   potassium chloride 10 mEq in 100 mL IVPB, 10 mEq, Intravenous, Q1 Hr x 2, Zhang, Dekui, MD   potassium chloride SA (KLOR-CON M) CR tablet 40 mEq, 40 mEq, Oral, BID, Sharen Hones, MD   vitamin B-12 (CYANOCOBALAMIN) tablet  1,000 mcg, 1,000 mcg, Oral, Daily, Cox, Amy N, DO, 1,000 mcg at 04/14/21 1020    ALLERGIES   Patient has no known allergies.     REVIEW OF SYSTEMS    Review of Systems:  Gen:  Denies  fever, sweats, chills weigh loss  HEENT: Denies blurred vision, double vision, ear pain, eye pain, hearing loss, nose bleeds, sore throat Cardiac:  No dizziness, chest pain or heaviness, chest tightness,edema Resp:   Denies cough or sputum porduction, shortness of breath,wheezing, hemoptysis,  Gi: Denies swallowing difficulty, stomach pain, nausea or vomiting, diarrhea, constipation, bowel incontinence Gu:  Denies bladder incontinence, burning urine Ext:   Denies Joint pain, stiffness or swelling Skin: Denies  skin rash, easy bruising or bleeding or hives Endoc:  Denies polyuria, polydipsia , polyphagia or weight change Psych:   Denies depression, insomnia or hallucinations   Other:  All other systems negative   VS: BP (!) 122/47 (BP Location: Left Arm)    Pulse 69    Temp 98.3 F (36.8 C)    Resp 18    Ht '5\' 5"'  (1.651 m)    Wt 58.5 kg    SpO2 94%    BMI 21.46 kg/m      PHYSICAL EXAM    GENERAL:NAD, no fevers, chills, no weakness no fatigue HEAD: Normocephalic, atraumatic.  EYES: Pupils equal, round, reactive to light. Extraocular muscles intact. No scleral icterus.  MOUTH: Moist mucosal membrane. Dentition intact. No abscess noted.  EAR, NOSE, THROAT: Clear without exudates. No external lesions.  NECK: Supple. No thyromegaly. No nodules. No JVD.  PULMONARY: mild rhonchi  CARDIOVASCULAR: S1 and S2. Regular rate and rhythm. No murmurs, rubs, or gallops. No edema. Pedal pulses 2+ bilaterally.  GASTROINTESTINAL: Soft, nontender, nondistended. No masses. Positive bowel sounds. No hepatosplenomegaly.  MUSCULOSKELETAL: No swelling, clubbing, or edema. Range of motion full in all extremities.  NEUROLOGIC: Cranial nerves II through XII are intact. No gross focal neurological deficits. Sensation  intact. Reflexes intact.  SKIN: No ulceration, lesions, rashes, or cyanosis. Skin warm and dry. Turgor intact.  PSYCHIATRIC: Mood, affect within normal limits. The patient is awake, alert and oriented x 3. Insight, judgment intact.       IMAGING    CT ABDOMEN PELVIS W CONTRAST  Result Date: 04/11/2021 CLINICAL DATA:  Right-sided abdominal pain EXAM: CT ABDOMEN AND PELVIS WITH CONTRAST TECHNIQUE: Multidetector CT imaging of the abdomen and pelvis was performed using the standard protocol following bolus administration of intravenous contrast. RADIATION DOSE REDUCTION: This exam was performed according to the departmental dose-optimization program which includes automated exposure control, adjustment of the mA and/or kV according to patient size and/or use of iterative reconstruction technique. CONTRAST:  122m OMNIPAQUE IOHEXOL 300 MG/ML  SOLN COMPARISON:  None. FINDINGS: Lower chest: Loculated appearing small right pleural effusion is partially imaged. There is adjacent  atelectasis. Hepatobiliary: No focal liver abnormality is seen. No gallstones, gallbladder wall thickening, or biliary dilatation. Pancreas: Unremarkable. No pancreatic ductal dilatation or surrounding inflammatory changes. Spleen: Normal in size without focal abnormality. Adrenals/Urinary Tract: Adrenals are unremarkable. Too small to characterize low-density lesions of the right kidney. Bladder is unremarkable. Stomach/Bowel: Stomach is within normal limits. Bowel is normal in caliber. Sigmoid diverticulosis. Vascular/Lymphatic: Atherosclerosis.  No enlarged nodes. Reproductive: Status post hysterectomy. No adnexal masses. Other: No free fluid.  No acute abnormality of the abdominal wall. Musculoskeletal: No acute osseous abnormality. IMPRESSION: Partially imaged loculated appearing small right pleural effusion. No acute abnormality in the abdomen or pelvis. Electronically Signed   By: Macy Mis M.D.   On: 04/11/2021 13:05   DG  Chest Port 1 View  Result Date: 04/12/2021 CLINICAL DATA:  Post thoracentesis. EXAM: PORTABLE CHEST 1 VIEW COMPARISON:  Chest XR, 04/11/2021.  CT chest, 03/05/2011. FINDINGS: Cardiomediastinal silhouette is unchanged. LEFT lung is are well inflated. Interval resolution of small volume RIGHT pleural effusion, with trace residual. Residual RIGHT basilar hazy opacity, likely atelectasis. Mild blunting of the LEFT costophrenic angle. No pneumothorax. No acute osseous abnormality. IMPRESSION: 1. Interval resolution of small volume RIGHT pleural effusion, with trace residual. 2. No postprocedure pneumothorax. Electronically Signed   By: Michaelle Birks M.D.   On: 04/12/2021 15:18   DG Chest Port 1 View  Result Date: 04/11/2021 CLINICAL DATA:  Evaluate pleural effusion seen on CT. EXAM: PORTABLE CHEST 1 VIEW COMPARISON:  None. FINDINGS: Mild to moderate severity diffuse, chronic appearing increased interstitial lung markings are seen. Mild areas of atelectasis are noted within the bilateral lung bases. There are very small bilateral pleural effusions. No pneumothorax is identified. The heart size and mediastinal contours are within normal limits. There is marked severity calcification of the aortic arch. The visualized skeletal structures are unremarkable. IMPRESSION: 1. Chronic appearing increased interstitial lung markings. Mild, superimposed component of interstitial edema cannot be excluded. 2. Mild bibasilar atelectasis. 3. Very small bilateral pleural effusions. Electronically Signed   By: Virgina Norfolk M.D.   On: 04/11/2021 18:59   ECHOCARDIOGRAM COMPLETE  Result Date: 04/12/2021    ECHOCARDIOGRAM REPORT   Patient Name:   Katherine Bradshaw Date of Exam: 04/12/2021 Medical Rec #:  [id number]       Height:       65.0 in Accession #:    [id number]      Weight:       129.0 lb Date of Birth:  1940/11/13       BSA:          1.642 m Patient Age:    33 years        BP:           146/57 mmHg Patient Gender: F                HR:           66 bpm. Exam Location:  [facility] Procedure: 2D Echo, Color Doppler and Cardiac Doppler Indications:     I48.91 Atrial fibrillation  History:         Patient has no prior history of Echocardiogram examinations.                  Signs/Symptoms:Shortness of Breath and Weakness.  Sonographer:     Charmayne Sheer Referring Phys:  [id number] AMY N COX Diagnosing Phys: Ida Rogue MD IMPRESSIONS  1. Left ventricular ejection fraction, by estimation, is 60 to 65%. The  left ventricle has normal function. The left ventricle has no regional wall motion abnormalities. Left ventricular diastolic parameters are consistent with Grade I diastolic dysfunction (impaired relaxation).  2. Right ventricular systolic function is normal. The right ventricular size is normal.  3. The mitral valve is normal in structure. Moderate mitral valve regurgitation. No evidence of mitral stenosis. There is mild late systolic prolapse of multiple scallops of the posterior leaflet of the mitral valve.  4. The aortic valve is tricuspid. Aortic valve regurgitation is not visualized. No aortic stenosis is present.  5. The inferior vena cava is normal in size with greater than 50% respiratory variability, suggesting right atrial pressure of 3 mmHg. FINDINGS  Left Ventricle: Left ventricular ejection fraction, by estimation, is 60 to 65%. The left ventricle has normal function. The left ventricle has no regional wall motion abnormalities. The left ventricular internal cavity size was normal in size. There is  no left ventricular hypertrophy. Left ventricular diastolic parameters are consistent with Grade I diastolic dysfunction (impaired relaxation). Right Ventricle: The right ventricular size is normal. No increase in right ventricular wall thickness. Right ventricular systolic function is normal. Left Atrium: Left atrial size was normal in size. Right Atrium: Right atrial size was normal in size. Pericardium: There is no evidence of pericardial  effusion. Mitral Valve: The mitral valve is normal in structure. There is mild late systolic prolapse of multiple scallops of the posterior leaflet of the mitral valve. Moderate mitral valve regurgitation. No evidence of mitral valve stenosis. MV peak gradient, 7.0 mmHg. The mean mitral valve gradient is 2.5 mmHg. Tricuspid Valve: The tricuspid valve is normal in structure. Tricuspid valve regurgitation is mild . No evidence of tricuspid stenosis. Aortic Valve: The aortic valve is tricuspid. Aortic valve regurgitation is not visualized. No aortic stenosis is present. Aortic valve mean gradient measures 4.0 mmHg. Aortic valve peak gradient measures 7.2 mmHg. Aortic valve area, by VTI measures 2.90 cm. Pulmonic Valve: The pulmonic valve was normal in structure. Pulmonic valve regurgitation is trivial. No evidence of pulmonic stenosis. Aorta: The aortic root is normal in size and structure. Venous: The inferior vena cava is normal in size with greater than 50% respiratory variability, suggesting right atrial pressure of 3 mmHg. IAS/Shunts: No atrial level shunt detected by color flow Doppler.  LEFT VENTRICLE PLAX 2D LVIDd:         3.71 cm   Diastology LVIDs:         2.32 cm   LV e' lateral:   8.86 cm/s LV PW:         1.03 cm   LV E/e' lateral: 14.9 LV IVS:        0.84 cm LVOT diam:     2.00 cm LV SV:         63 LV SV Index:   38 LVOT Area:     3.14 cm  RIGHT VENTRICLE RV Basal diam:  2.54 cm LEFT ATRIUM           Index        RIGHT ATRIUM          Index LA diam:      3.10 cm 1.89 cm/m   RA Area:     8.89 cm LA Vol (A2C): 23.5 ml 14.32 ml/m  RA Volume:   15.40 ml 9.38 ml/m LA Vol (A4C): 19.5 ml 11.88 ml/m  AORTIC VALVE  PULMONIC VALVE AV Area (Vmax):    2.77 cm     PV Vmax:          0.88 m/s AV Area (Vmean):   2.57 cm     PV Vmean:         59.800 cm/s AV Area (VTI):     2.90 cm     PV VTI:           0.156 m AV Vmax:           134.00 cm/s  PV Peak grad:     3.1 mmHg AV Vmean:          87.200  cm/s  PV Mean grad:     2.0 mmHg AV VTI:            0.218 m      PR End Diast Vel: 4.67 msec AV Peak Grad:      7.2 mmHg AV Mean Grad:      4.0 mmHg LVOT Vmax:         118.00 cm/s LVOT Vmean:        71.300 cm/s LVOT VTI:          0.201 m LVOT/AV VTI ratio: 0.92  AORTA Ao Root diam: 3.20 cm MITRAL VALVE MV Area (PHT): 4.96 cm     SHUNTS MV Area VTI:   2.08 cm     Systemic VTI:  0.20 m MV Peak grad:  7.0 mmHg     Systemic Diam: 2.00 cm MV Mean grad:  2.5 mmHg MV Vmax:       1.32 m/s MV Vmean:      74.8 cm/s MV Decel Time: 153 msec MV E velocity: 131.67 cm/s MV A velocity: 92.45 cm/s MV E/A ratio:  1.42 Ida Rogue MD Electronically signed by Ida Rogue MD Signature Date/Time: 04/12/2021/12:50:34 PM    Final    US THORACENTESIS ASP PLEURAL SPACE W/IMG GUIDE  Result Date: 04/12/2021 INDICATION: Patient increasing shortness of breath found to have AFib and bacterial pneumonia with a right sided pleural effusion. Request is for therapeutic and diagnostic right-sided thoracentesis EXAM: ULTRASOUND GUIDED DIAGNOSTIC AND THERAPEUTIC RIGHT-SIDED THORACENTESIS MEDICATIONS: Lidocaine 1% 10 mL COMPLICATIONS: None immediate. PROCEDURE: An ultrasound guided thoracentesis was thoroughly discussed with the patient and questions answered. The benefits, risks, alternatives and complications were also discussed. The patient understands and wishes to proceed with the procedure. Written consent was obtained. Ultrasound was performed to localize and mark an adequate pocket of fluid in the right chest. The area was then prepped and draped in the normal sterile fashion. 1% Lidocaine was used for local anesthesia. Under ultrasound guidance a 6 Fr Safe-T-Centesis catheter was introduced. Thoracentesis was performed. The catheter was removed and a dressing applied. FINDINGS: A total of approximately 50 mL of amber colored fluid was removed. Samples were sent to the laboratory as requested by the clinical team. A second pocket  loculated phlegmon with noted to be in the right bases. Area in question too thick for thoracentesis. IMPRESSION: Successful ultrasound guided diagnostic and therapeutic right-sided thoracentesis yielding 50 mL of pleural fluid. Procedure performed and read by: Rushie Nyhan, IR NP Electronically Signed   By: Michaelle Birks M.D.   On: 04/12/2021 16:03    Component Ref Range & Units 1 d ago  Fluid Type-FCT  CYTO PLEU   Color, Fluid YELLOW ORANGE Abnormal    Appearance, Fluid CLEAR CLOUDY Abnormal    Total Nucleated Cell Count, Fluid cu mm 5,842  Neutrophil Count, Fluid % 90   Lymphs, Fluid % 5   Monocyte-Macrophage-Serous Fluid % 5   Eos, Fluid % 0   Comment: Performed at [facility], [address] [place], [place] [address]  Resulting Agency  [facility]   FINDINGS: A total of approximately 50 mL of amber colored fluid was removed. Samples were sent to the laboratory as requested by the clinical team. A second pocket loculated phlegmon with noted to be in the right bases. Area in question too thick for thoracentesis.   IMPRESSION: Successful ultrasound guided diagnostic and therapeutic right-sided thoracentesis yielding 50 mL of pleural fluid.   Procedure performed and read by: Rushie Nyhan, IR NP    ASSESSMENT/PLAN    Severe community acquired pneumonia - present on admission  - COVID19 negative   - supplemental O2 during my evaluation -room air  -serum fungitell -legionella ab -strep pneumoniae ur AG -Histoplasma Ur Ag -sputum resp cultures -no need for AFB sputum expectorated specimen -sputum cytology  -reviewed pertinent imaging with patient today - ESR/CRP ANA comprenesisve Procalcitonin trend -MRSA negative  -please encourage patient to use incentive spirometer few times each hour while hospitalized.   -s/p VZV infection    Right parapneumonic effusion -   patient is improved on room air in no distress and does have chest discomfort   -  PMN predominant exudative effusion with negative gram stain and culture thus far   -peripheral blood film with leukemoid reaction suggestive of infection -if needed patient is agreeable to chest tube   Atrial fibrillation with RVR    - Resolved   -s/p cardiology evaluation     Thank you for allowing me to participate in the care of this patient.   -patient has at least one life threatening severe medical condition which is being actively treated and managed during this evaluation.    Patient/Family are satisfied with care plan and all questions have been answered.  This document was prepared using Dragon voice recognition software and may include unintentional dictation errors.     Ottie Glazier, M.D.  Division of [place]

## 2021-04-14 NOTE — Progress Notes (Signed)
?Progress Note ? ? ?Patient: IVONNE FREEBURG DGL:[id number] DOB: 1940-07-14 DOA: 04/11/2021     2 ?DOS: the patient was seen and examined on 04/14/2021 ?  ?Brief hospital course: ?Ms. Noland Fordyce Mcglaughlin is a 81 year old female with medical history of hypothyroid, GERD, who presents to the emergency department from [facility] clinic for chief concerns of shortness of breath, right lower quadrant pain, weakness. ?Patient had shingles about a month ago, had upper respiratory infection for the last week.  Started have a cough and shortness of breath.  Also had right lower quadrant abdominal pain, which he has resolved after 2 episode of diarrhea at early morning of 3/10. ?Upon arriving the hospital, she had severe leukocytosis with white cell count 72.2, reactive thrombocytosis, lactic acidosis of 2.7.  Sodium 123.  Her vital signs showed atrial fibrillation with RVR 126, tachypnea.  She was placed on broad-spectrum antibiotics for severe sepsis.  Her CT scan of abdomen/pelvis did not show any acute changes.  Lower chest had a small loculated pleural effusion.  However on chest x-ray, there is very small amount of pleural effusion. ?Thoracentesis was performed on 3/10, removed 50 mL of fluids.  LDH 3085, neutrophil 90% with total white cell 5842. ? ?3/12.  Condition improving, antibiotic changed to Unasyn. ? ?Assessment and Plan: ?* New onset atrial fibrillation ([place]) ?Chadsvasc score is 4, patient is back to sinus again.  Continue Eliquis. ? ? ?Iron deficiency anemia ?Repleted with oral iron. ? ?Acute on chronic diastolic CHF (congestive heart failure) ([place]) ?Echocardiogram showed normal ejection fraction, elevated BNP probably from volume.  She received IV Lasix, condition much improved.  ? ?Reactive thrombocytosis ?Improved. ? ?Secondary bacterial pneumonia ?Patient had a recent viral infection, followed by cough and shortness of breath.  Procalcitonin level elevated.  Reviewed chest x-ray, most likely has bilateral lower lobe  pneumonia. ?This is a postviral bacterial pneumonia.   ?Patient also has significant parapneumonic effusion, removed 50 mL of fluids. ?Condition has improved, antibiotic changed to Unasyn for a few days.  Augmentin at discharge. ? ?Severe sepsis ([place]) ?Patient met severe sepsis criteria with severe leukocytosis, tachypnea and tachycardia.  Patient also has elevated lactic acid.  She received IV fluids, condition has improved. ?Blood culture negative in  2 days.  ? ?Hypothyroidism ?Continue Synthroid. ? ?Leukocytosis ?Patient has a leukemoid reaction with a severe leukocytosis.  Appreciate oncology consult, leukocytosis is drastically improving.  This appears to be secondary to infection. ? ?Loculated pleural effusion ?As above. ? ?Hyponatremia ?We will give another dose IV potassium today ? ?Parapneumonic effusion ?Thoracentesis culture negative in 2 days.  Appreciate pulmonology consult. ? ? ?Acute CHF ([facility])-resolved as of 04/13/2021 ?Patient condition is consistent with new onset congestive heart failure. ?She had a recent viral infection, consider the possibility of myocarditis versus other cause of congestive heart failure. ?Reviewed the EKG, showed atrial fibrillation with T wave changes. ?Check a troponin and echocardiogram. ?Patient is hemodynamically stable now, will start IV Lasix at 40 mg daily.  Monitor electrolytes. ? ? ? ? ?  ? ?Subjective:  ?Patient feels much improved, no short of breath today.  Minimal cough. ?No fever or chills ?She has some runny stools for the last couple days.  Added Imodium.  No abdominal pain or nausea vomiting. ? ?Physical Exam: ?Vitals:  ? 04/13/21 2324 04/14/21 0420 04/14/21 0803 04/14/21 1201  ?BP: (!) 120/40 (!) 126/46 (!) 122/47 137/71  ?Pulse: 69 68 69 63  ?Resp: _0 ?Temp: 98.2 ?  F (36.8 ?C) 98 ?F (36.7 ?C) 98.3 ?F (36.8 ?C) 98 ?F (36.7 ?C)  ?TempSrc:      ?SpO2: 94% 92% 94% (!) 89%  ?Weight:      ?Height:      ? ?General exam: Appears calm and comfortable   ?Respiratory system: Clear to auscultation. Respiratory effort normal. ?Cardiovascular system: S1 & S2 heard, RRR. No JVD, murmurs, rubs, gallops or clicks. No pedal edema. ?Gastrointestinal system: Abdomen is nondistended, soft and nontender. No organomegaly or masses felt. Normal bowel sounds heard. ?Central nervous system: Alert and oriented. No focal neurological deficits. ?Extremities: Symmetric 5 x 5 power. ?Skin: No rashes, lesions or ulcers ?Psychiatry: Judgement and insight appear normal. Mood & affect appropriate.  ? ?Data Reviewed: ?Labs reviewed ? ?Family Communication:  ? ?Disposition: ?Status is: Inpatient ?Remains inpatient appropriate because: Severity of disease, IV antibiotics ? Planned Discharge Destination: Home with Home Health ? ? ? ?Time spent: 27 minutes ? ?Author: ?Sharen Hones, MD ?04/14/2021 12:56 PM ? ?For on call review [email or web address].  ?

## 2021-04-15 ENCOUNTER — Inpatient Hospital Stay: Payer: PPO

## 2021-04-15 ENCOUNTER — Encounter: Payer: Self-pay | Admitting: Internal Medicine

## 2021-04-15 DIAGNOSIS — I5033 Acute on chronic diastolic (congestive) heart failure: Secondary | ICD-10-CM | POA: Diagnosis not present

## 2021-04-15 DIAGNOSIS — J189 Pneumonia, unspecified organism: Secondary | ICD-10-CM | POA: Diagnosis not present

## 2021-04-15 DIAGNOSIS — I4891 Unspecified atrial fibrillation: Secondary | ICD-10-CM | POA: Diagnosis not present

## 2021-04-15 DIAGNOSIS — J154 Pneumonia due to other streptococci: Secondary | ICD-10-CM

## 2021-04-15 LAB — CBC WITH DIFFERENTIAL/PLATELET
Abs Immature Granulocytes: 1.65 10*3/uL — ABNORMAL HIGH (ref 0.00–0.07)
Basophils Absolute: 0.1 10*3/uL (ref 0.0–0.1)
Basophils Relative: 0 %
Eosinophils Absolute: 0.2 10*3/uL (ref 0.0–0.5)
Eosinophils Relative: 1 %
HCT: 30.9 % — ABNORMAL LOW (ref 36.0–46.0)
Hemoglobin: 10.1 g/dL — ABNORMAL LOW (ref 12.0–15.0)
Immature Granulocytes: 6 %
Lymphocytes Relative: 10 %
Lymphs Abs: 2.6 10*3/uL (ref 0.7–4.0)
MCH: 28.2 pg (ref 26.0–34.0)
MCHC: 32.7 g/dL (ref 30.0–36.0)
MCV: 86.3 fL (ref 80.0–100.0)
Monocytes Absolute: 2.1 10*3/uL — ABNORMAL HIGH (ref 0.1–1.0)
Monocytes Relative: 8 %
Neutro Abs: 19.5 10*3/uL — ABNORMAL HIGH (ref 1.7–7.7)
Neutrophils Relative %: 75 %
Platelets: 526 10*3/uL — ABNORMAL HIGH (ref 150–400)
RBC: 3.58 MIL/uL — ABNORMAL LOW (ref 3.87–5.11)
RDW: 13.8 % (ref 11.5–15.5)
Smear Review: NORMAL
WBC: 26.1 10*3/uL — ABNORMAL HIGH (ref 4.0–10.5)
nRBC: 0 % (ref 0.0–0.2)

## 2021-04-15 LAB — COMPREHENSIVE METABOLIC PANEL
ALT: 12 U/L (ref 0–44)
AST: 12 U/L — ABNORMAL LOW (ref 15–41)
Albumin: 2.1 g/dL — ABNORMAL LOW (ref 3.5–5.0)
Alkaline Phosphatase: 100 U/L (ref 38–126)
Anion gap: 4 — ABNORMAL LOW (ref 5–15)
BUN: 10 mg/dL (ref 8–23)
CO2: 26 mmol/L (ref 22–32)
Calcium: 7.9 mg/dL — ABNORMAL LOW (ref 8.9–10.3)
Chloride: 103 mmol/L (ref 98–111)
Creatinine, Ser: 0.73 mg/dL (ref 0.44–1.00)
GFR, Estimated: >60 mL/min (ref >60)
Glucose, Bld: 120 mg/dL — ABNORMAL HIGH (ref 70–99)
Potassium: 4.5 mmol/L (ref 3.5–5.1)
Sodium: 133 mmol/L — ABNORMAL LOW (ref 135–145)
Total Bilirubin: 0.7 mg/dL (ref 0.3–1.2)
Total Protein: 6.4 g/dL — ABNORMAL LOW (ref 6.5–8.1)

## 2021-04-15 LAB — ANA COMPREHENSIVE PANEL

## 2021-04-15 LAB — LEGIONELLA PNEUMOPHILA SEROGP 1 UR AG: L. pneumophila Serogp 1 Ur Ag: NEGATIVE

## 2021-04-15 LAB — COMP PANEL: LEUKEMIA/LYMPHOMA

## 2021-04-15 LAB — HISTOPLASMA GAL'MANNAN AG SER: Histoplasma Gal'mannan Ag Ser: <0.5 (ref <0.5)

## 2021-04-15 LAB — MAGNESIUM: Magnesium: 2 mg/dL (ref 1.7–2.4)

## 2021-04-15 MED ORDER — IOHEXOL 300 MG/ML  SOLN
75.0000 mL | Freq: Once | INTRAMUSCULAR | Status: AC | PRN
  Administered 2021-04-15: 75 mL via INTRAVENOUS

## 2021-04-15 NOTE — Progress Notes (Signed)
?Progress Note ? ? ?Patient: Katherine Bradshaw DOB: 1940-12-07 DOA: 04/11/2021     3 ?DOS: the patient was seen and examined on 04/15/2021 ?  ?Brief hospital course: ?[name] is a 81 year old female with medical history of hypothyroid, GERD, who presents to the emergency department from [facility] clinic for chief concerns of shortness of breath, right lower quadrant pain, weakness. ?Patient had shingles about a month ago, had upper respiratory infection for the last week.  Started have a cough and shortness of breath.  Also had right lower quadrant abdominal pain, which he has resolved after 2 episode of diarrhea at early morning of 3/10. ?Upon arriving the hospital, she had severe leukocytosis with white cell count 72.2, reactive thrombocytosis, lactic acidosis of 2.7.  Sodium 123.  Her vital signs showed atrial fibrillation with RVR 126, tachypnea.  She was placed on broad-spectrum antibiotics for severe sepsis.  Her CT scan of abdomen/pelvis did not show any acute changes.  Lower chest had a small loculated pleural effusion.  However on chest x-ray, there is very small amount of pleural effusion. ?Thoracentesis was performed on 3/10, removed 50 mL of fluids.  LDH 3085, neutrophil 90% with total white cell 5842. ? ?3/12.  Condition improving, antibiotic changed to Unasyn. ? ?Assessment and Plan: ?* New onset atrial fibrillation ([place]) ?Chadsvasc score is 4, patient is back to sinus again.  Discontinue Eliquis just in case patient need chest tube placement. ? ? ?Pneumonia due to streptococcus, group A ([place]) ?As above. ? ?Iron deficiency anemia ?Continue oral iron ? ?Acute on chronic diastolic CHF (congestive heart failure) ([place]) ?Echocardiogram showed normal ejection fraction, elevated BNP probably from volume.  She received IV Lasix. ?No need for additional Lasix ? ?Reactive thrombocytosis ?Improved. ? ?Secondary bacterial pneumonia ?Patient had a recent viral infection, followed by cough and  shortness of breath.  Procalcitonin level elevated.  Reviewed chest x-ray, most likely has bilateral lower lobe pneumonia. ?This is a postviral bacterial pneumonia.   ?Positive ASO, likely group A strep pneumonia. ?Continue on Unasyn for now. ? ? ?Severe sepsis ([place]) ?Patient met severe sepsis criteria with severe leukocytosis, tachypnea and tachycardia.  Patient also has elevated lactic acid.  She received IV fluids, condition has improved. ?Blood culture negative. ? ?Hypothyroidism ?Continue Synthroid. ? ?Leukemoid reaction ?Condition improving. ? ?Loculated pleural effusion ?As above. ? ?Hyponatremia ?Improved ? ?Parapneumonic effusion ?Thoracentesis culture negative in 2 days.   ?Repeated chest x-ray on 3/13 showed increased loculated right-sided pleural effusion.  Discussed with Dr. Dahlia Bailiff, will need a repeat thoracentesis.  Radiologist recommended a CT scan with contrast to help with the decision if chest tube is needed. ?Continue Unasyn. ? ? ?Acute CHF ([facility])-resolved as of 04/13/2021 ?Patient condition is consistent with new onset congestive heart failure. ?She had a recent viral infection, consider the possibility of myocarditis versus other cause of congestive heart failure. ?Reviewed the EKG, showed atrial fibrillation with T wave changes. ?Check a troponin and echocardiogram. ?Patient is hemodynamically stable now, will start IV Lasix at 40 mg daily.  Monitor electrolytes. ? ? ? ? ?  ? ?Subjective:  ?Patient feels much improved, no short of breath or cough.  Able to walk.  No nausea vomiting or diarrhea.  No abdominal pain. ?No fever or chills. ? ?Physical Exam: ?Vitals:  ? 04/15/21 0446 04/15/21 0451 04/15/21 0752 04/15/21 1156  ?BP: (!) 110/56 (!) 174/106 (!) 114/54 (!) 141/52  ?Pulse: 77 94 79 78  ?Resp: _0 ?  Temp: 97.8 ?F (36.6 ?C) 98.1 ?F (36.7 ?C) 98.4 ?F (36.9 ?C) 97.9 ?F (36.6 ?C)  ?TempSrc:      ?SpO2: 95% 98% 93% 94%  ?Weight:      ?Height:      ? ?General exam: Appears calm and  comfortable  ?Respiratory system: Decreased breathing sound at right base. Respiratory effort normal. ?Cardiovascular system: S1 & S2 heard, RRR. No JVD, murmurs, rubs, gallops or clicks. No pedal edema. ?Gastrointestinal system: Abdomen is nondistended, soft and nontender. No organomegaly or masses felt. Normal bowel sounds heard. ?Central nervous system: Alert and oriented. No focal neurological deficits. ?Extremities: Symmetric 5 x 5 power. ?Skin: No rashes, lesions or ulcers ?Psychiatry: Judgement and insight appear normal. Mood & affect appropriate.  ? ?Data Reviewed: ?Reviewed chest x-ray results and image, reviewed all lab results ? ?Family Communication: son updated ? ?Disposition: ?Status is: Inpatient ?Remains inpatient appropriate because: severity of disease, iv abx ? Planned Discharge Destination: Home with Home Health ? ? ? ?Time spent: 34 minutes ? ?Author: ?Sharen Hones, MD ?04/15/2021 12:40 PM ? ?For on call review [email or web address].  ?

## 2021-04-15 NOTE — Progress Notes (Signed)
Physical Therapy Treatment ?Patient Details ?Name: Katherine Bradshaw ?MRN: [id number] ?DOB: 1940-11-29 ?Today's Date: 04/15/2021 ? ? ?History of Present Illness Patient is an 81 year old female with a PMH (+) for  hypothyroidism, and GERD. Patient presented to ED for SOB, and was admitted for new onset of atrial fibrillation, bacterial pneumonia c/b loculated pleural effusion, and acute CHF. ? ?  ?PT Comments  ? ? Full session limited due to transport needing to take pt to x-ray.  Pt able to stand at bedside and ambulate in room and bathroom with mod-I with use of QC. Pt performing lap around nurses station with adequate gait speed with use of QC with intermittent lateral unsteadiness that pt corrects with QC and without need for PT assist but with supervision. Pt performing asc/desc 6 stairs with SUE use and QC with step through pattern asc and step through desc which is improved from previous session with adequate eccentric control with descent. Pt returned to room left in care of transport. D/c recs remain appropriate. ?  ?Recommendations for follow up therapy are one component of a multi-disciplinary discharge planning process, led by the attending physician.  Recommendations may be updated based on patient status, additional functional criteria and insurance authorization. ? ?Follow Up Recommendations ? No PT follow up ?  ?  ?Assistance Recommended at Discharge PRN  ?Patient can return home with the following Help with stairs or ramp for entrance ?  ?Equipment Recommendations ? None recommended by PT  ?  ?Recommendations for Other Services   ? ? ?  ?Precautions / Restrictions Precautions ?Precautions: Fall ?Restrictions ?Weight Bearing Restrictions: No  ?  ? ?Mobility ? Bed Mobility ?Overal bed mobility: Modified Independent ?  ?  ?  ?  ?  ?  ?  ?Patient Response: Cooperative ? ?Transfers ?Overall transfer level: Independent ?  ?  ?  ?  ?  ?  ?  ?  ?General transfer comment: able to stand without need for QC ?   ? ?Ambulation/Gait ?Ambulation/Gait assistance: Supervision ?Gait Distance (Feet): 200 Feet ?Assistive device: Quad cane ?Gait Pattern/deviations: Step-through pattern, Narrow base of support ?  ?  ?  ?General Gait Details: Intermittent lateral usteadiness with gait but able to correct indep with QC ? ? ?Stairs ?Stairs: Yes ?Stairs assistance: Min guard ?Stair Management: One rail Right, One rail Left, Alternating pattern, Forwards, With cane ?Number of Stairs: 6 ?General stair comments: Use of QC in RUE and SUE on handrail with step through pattern. Correct use of QC throughout ? ? ?Wheelchair Mobility ?  ? ?Modified Rankin (Stroke Patients Only) ?  ? ? ?  ?Balance Overall balance assessment: Needs assistance ?Sitting-balance support: No upper extremity supported, Feet supported ?Sitting balance-Leahy Scale: Normal ?  ?  ?Standing balance support: No upper extremity supported ?Standing balance-Leahy Scale: Good ?  ?  ?  ?  ?  ?  ?  ?  ?  ?  ?  ?  ?  ? ?  ?Cognition Arousal/Alertness: Awake/alert ?Behavior During Therapy: [facility] for tasks assessed/performed ?Overall Cognitive Status: Within Functional Limits for tasks assessed ?  ?  ?  ?  ?  ?  ?  ?  ?  ?  ?  ?  ?  ?  ?  ?  ?  ?  ?  ? ?  ?Exercises   ? ?  ?General Comments General comments (skin integrity, edema, etc.): Max HR up to 108 BPM post stairs and ambulation ?  ?  ? ?  Pertinent Vitals/Pain Pain Assessment ?Pain Assessment: No/denies pain  ? ? ?Home Living   ?  ?  ?  ?  ?  ?  ?  ?  ?  ?   ?  ?Prior Function    ?  ?  ?   ? ?PT Goals (current goals can now be found in the care plan section) Acute Rehab PT Goals ?Patient Stated Goal: to go home ?PT Goal Formulation: With patient ?Time For Goal Achievement: 04/27/21 ?Potential to Achieve Goals: Good ?Progress towards PT goals: Progressing toward goals ? ?  ?Frequency ? ? ? Min 2X/week ? ? ? ?  ?PT Plan Current plan remains appropriate  ? ? ?Co-evaluation   ?  ?  ?  ?  ? ?  ?AM-PAC PT "6 Clicks" Mobility   ?Outcome  Measure ? Help needed turning from your back to your side while in a flat bed without using bedrails?: None ?Help needed moving from lying on your back to sitting on the side of a flat bed without using bedrails?: None ?Help needed moving to and from a bed to a chair (including a wheelchair)?: None ?Help needed standing up from a chair using your arms (e.g., wheelchair or bedside chair)?: None ?Help needed to walk in hospital room?: A Little ?Help needed climbing 3-5 steps with a railing? : A Little ?6 Click Score: 22 ? ?  ?End of Session Equipment Utilized During Treatment: Gait belt ?Activity Tolerance: Patient tolerated treatment well ?Patient left: in bed;with bed alarm set;with call bell/phone within reach ?Nurse Communication: Mobility status ?PT Visit Diagnosis: Muscle weakness (generalized) (M62.81);Unsteadiness on feet (R26.81) ?  ? ? ?Time: 0921-0929 ?PT Time Calculation (min) (ACUTE ONLY): 8 min ? ?Charges:  $Therapeutic Exercise: 8-22 mins          ?          ? ?Salem Caster. Fairly IV, PT, DPT ?Physical Therapist- [place]  ?[facility]  ?04/15/2021, 10:46 AM ? ?

## 2021-04-15 NOTE — Care Management Important Message (Signed)
Important Message ? ?Patient Details  ?Name: Katherine Bradshaw ?MRN: [id number] ?Date of Birth: 1940-12-18 ? ? ?Medicare Important Message Given:  N/A - LOS <3 / Initial given by admissions ? ? ? ? ?[name] ?04/15/2021, 10:57 AM ?

## 2021-04-15 NOTE — Progress Notes (Signed)
PULMONOLOGY         Date: 04/15/2021,   MRN# [id number] Katherine Bradshaw April 28, 1940     AdmissionWeight: 57.2 kg                 CurrentWeight: 58.5 kg   Referring physician: Dr Roosevelt Locks   CHIEF COMPLAINT:   Secondary bacterial pneumonia with bilateral effusions   HISTORY OF PRESENT ILLNESS   81 year old female with a history of GERD and hypothyroidism who came from [name] for dyspnea on exertion and shortness of breath at rest right-sided chest discomfort.  Presented to the ED with tachycardia mild hypotension and mild anemia.  Head CT imaging performed with pleural effusion noted.  In the ED developed atrial fibrillation with rapid ventricular response and received IV therapy including metoprolol and amiodarone with subsequent improvement.  Patient was seen by cardiology with additional medications delivered for arrhythmia.  Interventional radiology consultation placed for thoracentesis with pleural fluid studies ordered.  Empirically treated for pneumonia with vancomycin and cefepime as well as Flagyl received resuscitative fluids with LR.  Oncology consultation for severe leucocytosis.   Patient shares since March 5th on Sunday she developed sore throat and cough and then she got myalgia and malaise. She is has never smoked. She had sister with breast cancer who passed away and anther sister with breast cancer who survived. She reports severe fatigue for at least 5 years progressively worsening.    04/14/21- patient is substantially improved and bloodwork is improved in paralell. Granddaughter Katherine Bradshaw was present during interview and physical examination reports feeling well. She is on room air.  04/15/21-patient is stable, I met with Son Katherine Bradshaw to review serial chest imaging. She is agreeable to chest tube, she is NPO post midnight, shes of eliquis.   Gram + cocci on resp culture  PAST MEDICAL HISTORY   Past Medical History:  Diagnosis Date   Cancer ([place])    skin      SURGICAL HISTORY   Past Surgical History:  Procedure Laterality Date   BREAST BIOPSY Bilateral    neg     FAMILY HISTORY   Family History  Problem Relation Age of Onset   Breast cancer Sister 21     SOCIAL HISTORY   Social History   Tobacco Use   Smoking status: Never   Smokeless tobacco: Never  Substance Use Topics   Alcohol use: Not Currently   Drug use: Never     MEDICATIONS    Home Medication:    Current Medication:  Current Facility-Administered Medications:    0.9 %  sodium chloride infusion, , Intravenous, PRN, Sharen Hones, MD, Stopped at 04/15/21 0450   acetaminophen (TYLENOL) tablet 650 mg, 650 mg, Oral, Q6H PRN **OR** acetaminophen (TYLENOL) suppository 650 mg, 650 mg, Rectal, Q6H PRN, Cox, Amy N, DO   amiodarone (PACERONE) tablet 200 mg, 200 mg, Oral, BID, Roosevelt Locks, Dekui, MD, 200 mg at 04/15/21 1001   Ampicillin-Sulbactam (UNASYN) 3 g in sodium chloride 0.9 % 100 mL IVPB, 3 g, Intravenous, Q6H, Sharen Hones, MD, Last Rate: 200 mL/hr at 04/15/21 1004, 3 g at 04/15/21 1004   diltiazem (CARDIZEM SR) 12 hr capsule 120 mg, 120 mg, Oral, Q12H, Sharen Hones, MD, 120 mg at 04/15/21 1001   iron polysaccharides (NIFEREX) capsule 150 mg, 150 mg, Oral, Daily, Roosevelt Locks, Dekui, MD, 150 mg at 04/15/21 1001   latanoprost (XALATAN) 0.005 % ophthalmic solution 1 drop, 1 drop, Both Eyes, QHS, Cox, Amy N, DO, 1 drop  at 04/14/21 2208   levothyroxine (SYNTHROID) tablet 75 mcg, 75 mcg, Oral, Q0600, Cox, Amy N, DO, 75 mcg at 04/15/21 0544   loperamide (IMODIUM) capsule 2 mg, 2 mg, Oral, PRN, Sharen Hones, MD   melatonin tablet 5 mg, 5 mg, Oral, QHS PRN, Foust, Katy L, NP, 5 mg at 04/14/21 2206   metoprolol tartrate (LOPRESSOR) injection 5 mg, 5 mg, Intravenous, Q6H PRN, Sharen Hones, MD, 5 mg at 04/13/21 0941   multivitamin with minerals tablet 1 tablet, 1 tablet, Oral, Daily, Cox, Amy N, DO, 1 tablet at 04/15/21 1001   ondansetron (ZOFRAN) tablet 4 mg, 4 mg, Oral, Q6H PRN  **OR** ondansetron (ZOFRAN) injection 4 mg, 4 mg, Intravenous, Q6H PRN, Cox, Amy N, DO   pantoprazole (PROTONIX) EC tablet 40 mg, 40 mg, Oral, Daily, Cox, Amy N, DO, 40 mg at 04/15/21 1000   sodium chloride flush (NS) 0.9 % injection 3 mL, 3 mL, Intravenous, Q12H, Sharen Hones, MD, 3 mL at 04/15/21 1005   vitamin B-12 (CYANOCOBALAMIN) tablet 1,000 mcg, 1,000 mcg, Oral, Daily, Cox, Amy N, DO, 1,000 mcg at 04/15/21 1001    ALLERGIES   Patient has no known allergies.     REVIEW OF SYSTEMS    Review of Systems:  Gen:  Denies  fever, sweats, chills weigh loss  HEENT: Denies blurred vision, double vision, ear pain, eye pain, hearing loss, nose bleeds, sore throat Cardiac:  No dizziness, chest pain or heaviness, chest tightness,edema Resp:   Denies cough or sputum porduction, shortness of breath,wheezing, hemoptysis,  Gi: Denies swallowing difficulty, stomach pain, nausea or vomiting, diarrhea, constipation, bowel incontinence Gu:  Denies bladder incontinence, burning urine Ext:   Denies Joint pain, stiffness or swelling Skin: Denies  skin rash, easy bruising or bleeding or hives Endoc:  Denies polyuria, polydipsia , polyphagia or weight change Psych:   Denies depression, insomnia or hallucinations   Other:  All other systems negative   VS: BP (!) 141/52 (BP Location: Right Arm)    Pulse 78    Temp 97.9 F (36.6 C)    Resp 18    Ht '5\' 5"'  (1.651 m)    Wt 58.5 kg    SpO2 94%    BMI 21.46 kg/m      PHYSICAL EXAM    GENERAL:NAD, no fevers, chills, no weakness no fatigue HEAD: Normocephalic, atraumatic.  EYES: Pupils equal, round, reactive to light. Extraocular muscles intact. No scleral icterus.  MOUTH: Moist mucosal membrane. Dentition intact. No abscess noted.  EAR, NOSE, THROAT: Clear without exudates. No external lesions.  NECK: Supple. No thyromegaly. No nodules. No JVD.  PULMONARY: mild rhonchi  CARDIOVASCULAR: S1 and S2. Regular rate and rhythm. No murmurs, rubs, or  gallops. No edema. Pedal pulses 2+ bilaterally.  GASTROINTESTINAL: Soft, nontender, nondistended. No masses. Positive bowel sounds. No hepatosplenomegaly.  MUSCULOSKELETAL: No swelling, clubbing, or edema. Range of motion full in all extremities.  NEUROLOGIC: Cranial nerves II through XII are intact. No gross focal neurological deficits. Sensation intact. Reflexes intact.  SKIN: No ulceration, lesions, rashes, or cyanosis. Skin warm and dry. Turgor intact.  PSYCHIATRIC: Mood, affect within normal limits. The patient is awake, alert and oriented x 3. Insight, judgment intact.       IMAGING    DG Chest 2 View  Result Date: 04/15/2021 CLINICAL DATA:  Parapneumonic effusion. EXAM: CHEST - 2 VIEW COMPARISON:  04/12/2021 and CT chest 03/05/2011. FINDINGS: Trachea is midline. Heart is enlarged. Thoracic aorta is calcified. Moderate to large  loculated right pleural effusion with air-fluid levels. Collapse/consolidation in the right lung base. Biapical pleural thickening. Trace left pleural effusion. IMPRESSION: 1. Moderate to large loculated right pleural effusion with air-fluid levels. Amount of air is greater than expected for recent thoracentesis. Empyema can have this appearance. Bronchopleural or alveolar pleural fistula cannot be excluded. These results will be called to the ordering clinician or representative by the Radiologist Assistant, and communication documented in the PACS or [employer]. 2. Right basilar collapse/consolidation, possibly due to pneumonia. Followup PA and lateral chest X-ray is recommended in 3-4 weeks following trial of antibiotic therapy to ensure resolution and exclude underlying malignancy. 3. Trace left pleural effusion. Electronically Signed   By: Lorin Picket M.D.   On: 04/15/2021 09:51   CT CHEST W CONTRAST  Result Date: 04/15/2021 CLINICAL DATA:  Empyema. EXAM: CT CHEST WITH CONTRAST TECHNIQUE: Multidetector CT imaging of the chest was performed during  intravenous contrast administration. RADIATION DOSE REDUCTION: This exam was performed according to the departmental dose-optimization program which includes automated exposure control, adjustment of the mA and/or kV according to patient size and/or use of iterative reconstruction technique. CONTRAST:  20m OMNIPAQUE IOHEXOL 300 MG/ML  SOLN COMPARISON:  03/05/2011.  Abdomen pelvis CT 04/11/2021 FINDINGS: Cardiovascular: The heart size is normal. No substantial pericardial effusion. Mild atherosclerotic calcification is noted in the wall of the thoracic aorta. Mediastinum/Nodes: Mild mediastinal lymphadenopathy evident. 10 mm short axis AP window lymph node identified on 53/2. 14 mm short axis subcarinal lymph node evident. Mild lymphadenopathy noted right hilum. The esophagus has normal imaging features. There is no axillary lymphadenopathy. Lungs/Pleura: Loculated collection gas and fluid identified posterior right pleural space with loculated component subpleural fluid seen laterally and anteriorly at the right base. Basilar atelectasis noted in the right middle and lower lobes. No suspicious nodule or mass on the left. No left pleural effusion. Upper Abdomen: Unremarkable. Musculoskeletal: No worrisome lytic or sclerotic osseous abnormality. IMPRESSION: 1. Loculated gas and fluid collection in the posterior right pleural space with loculated component seen laterally and anteriorly at the right base. Imaging features compatible with reported history of empyema. 2. Mild mediastinal and right hilar lymphadenopathy, likely reactive. 3. Aortic Atherosclerosis (ICD10-I70.0). Electronically Signed   By: EMisty StanleyM.D.   On: 04/15/2021 12:53   CT ABDOMEN PELVIS W CONTRAST  Result Date: 04/11/2021 CLINICAL DATA:  Right-sided abdominal pain EXAM: CT ABDOMEN AND PELVIS WITH CONTRAST TECHNIQUE: Multidetector CT imaging of the abdomen and pelvis was performed using the standard protocol following bolus administration  of intravenous contrast. RADIATION DOSE REDUCTION: This exam was performed according to the departmental dose-optimization program which includes automated exposure control, adjustment of the mA and/or kV according to patient size and/or use of iterative reconstruction technique. CONTRAST:  108mOMNIPAQUE IOHEXOL 300 MG/ML  SOLN COMPARISON:  None. FINDINGS: Lower chest: Loculated appearing small right pleural effusion is partially imaged. There is adjacent atelectasis. Hepatobiliary: No focal liver abnormality is seen. No gallstones, gallbladder wall thickening, or biliary dilatation. Pancreas: Unremarkable. No pancreatic ductal dilatation or surrounding inflammatory changes. Spleen: Normal in size without focal abnormality. Adrenals/Urinary Tract: Adrenals are unremarkable. Too small to characterize low-density lesions of the right kidney. Bladder is unremarkable. Stomach/Bowel: Stomach is within normal limits. Bowel is normal in caliber. Sigmoid diverticulosis. Vascular/Lymphatic: Atherosclerosis.  No enlarged nodes. Reproductive: Status post hysterectomy. No adnexal masses. Other: No free fluid.  No acute abnormality of the abdominal wall. Musculoskeletal: No acute osseous abnormality. IMPRESSION: Partially imaged loculated appearing small right  pleural effusion. No acute abnormality in the abdomen or pelvis. Electronically Signed   By: Macy Mis M.D.   On: 04/11/2021 13:05   DG Chest Port 1 View  Result Date: 04/12/2021 CLINICAL DATA:  Post thoracentesis. EXAM: PORTABLE CHEST 1 VIEW COMPARISON:  Chest XR, 04/11/2021.  CT chest, 03/05/2011. FINDINGS: Cardiomediastinal silhouette is unchanged. LEFT lung is are well inflated. Interval resolution of small volume RIGHT pleural effusion, with trace residual. Residual RIGHT basilar hazy opacity, likely atelectasis. Mild blunting of the LEFT costophrenic angle. No pneumothorax. No acute osseous abnormality. IMPRESSION: 1. Interval resolution of small volume  RIGHT pleural effusion, with trace residual. 2. No postprocedure pneumothorax. Electronically Signed   By: Michaelle Birks M.D.   On: 04/12/2021 15:18   DG Chest Port 1 View  Result Date: 04/11/2021 CLINICAL DATA:  Evaluate pleural effusion seen on CT. EXAM: PORTABLE CHEST 1 VIEW COMPARISON:  None. FINDINGS: Mild to moderate severity diffuse, chronic appearing increased interstitial lung markings are seen. Mild areas of atelectasis are noted within the bilateral lung bases. There are very small bilateral pleural effusions. No pneumothorax is identified. The heart size and mediastinal contours are within normal limits. There is marked severity calcification of the aortic arch. The visualized skeletal structures are unremarkable. IMPRESSION: 1. Chronic appearing increased interstitial lung markings. Mild, superimposed component of interstitial edema cannot be excluded. 2. Mild bibasilar atelectasis. 3. Very small bilateral pleural effusions. Electronically Signed   By: Virgina Norfolk M.D.   On: 04/11/2021 18:59   ECHOCARDIOGRAM COMPLETE  Result Date: 04/12/2021    ECHOCARDIOGRAM REPORT   Patient Name:   PALLAVI CLIFTON Date of Exam: 04/12/2021 Medical Rec #:  [id number]       Height:       65.0 in Accession #:    [id number]      Weight:       129.0 lb Date of Birth:  06-Sep-1940       BSA:          1.642 m Patient Age:    28 years        BP:           146/57 mmHg Patient Gender: F               HR:           66 bpm. Exam Location:  [facility] Procedure: 2D Echo, Color Doppler and Cardiac Doppler Indications:     I48.91 Atrial fibrillation  History:         Patient has no prior history of Echocardiogram examinations.                  Signs/Symptoms:Shortness of Breath and Weakness.  Sonographer:     Charmayne Sheer Referring Phys:  [id number] AMY N COX Diagnosing Phys: Ida Rogue MD IMPRESSIONS  1. Left ventricular ejection fraction, by estimation, is 60 to 65%. The left ventricle has normal function. The left ventricle has  no regional wall motion abnormalities. Left ventricular diastolic parameters are consistent with Grade I diastolic dysfunction (impaired relaxation).  2. Right ventricular systolic function is normal. The right ventricular size is normal.  3. The mitral valve is normal in structure. Moderate mitral valve regurgitation. No evidence of mitral stenosis. There is mild late systolic prolapse of multiple scallops of the posterior leaflet of the mitral valve.  4. The aortic valve is tricuspid. Aortic valve regurgitation is not visualized. No aortic stenosis is present.  5. The inferior vena cava is  normal in size with greater than 50% respiratory variability, suggesting right atrial pressure of 3 mmHg. FINDINGS  Left Ventricle: Left ventricular ejection fraction, by estimation, is 60 to 65%. The left ventricle has normal function. The left ventricle has no regional wall motion abnormalities. The left ventricular internal cavity size was normal in size. There is  no left ventricular hypertrophy. Left ventricular diastolic parameters are consistent with Grade I diastolic dysfunction (impaired relaxation). Right Ventricle: The right ventricular size is normal. No increase in right ventricular wall thickness. Right ventricular systolic function is normal. Left Atrium: Left atrial size was normal in size. Right Atrium: Right atrial size was normal in size. Pericardium: There is no evidence of pericardial effusion. Mitral Valve: The mitral valve is normal in structure. There is mild late systolic prolapse of multiple scallops of the posterior leaflet of the mitral valve. Moderate mitral valve regurgitation. No evidence of mitral valve stenosis. MV peak gradient, 7.0 mmHg. The mean mitral valve gradient is 2.5 mmHg. Tricuspid Valve: The tricuspid valve is normal in structure. Tricuspid valve regurgitation is mild . No evidence of tricuspid stenosis. Aortic Valve: The aortic valve is tricuspid. Aortic valve regurgitation is not  visualized. No aortic stenosis is present. Aortic valve mean gradient measures 4.0 mmHg. Aortic valve peak gradient measures 7.2 mmHg. Aortic valve area, by VTI measures 2.90 cm. Pulmonic Valve: The pulmonic valve was normal in structure. Pulmonic valve regurgitation is trivial. No evidence of pulmonic stenosis. Aorta: The aortic root is normal in size and structure. Venous: The inferior vena cava is normal in size with greater than 50% respiratory variability, suggesting right atrial pressure of 3 mmHg. IAS/Shunts: No atrial level shunt detected by color flow Doppler.  LEFT VENTRICLE PLAX 2D LVIDd:         3.71 cm   Diastology LVIDs:         2.32 cm   LV e' lateral:   8.86 cm/s LV PW:         1.03 cm   LV E/e' lateral: 14.9 LV IVS:        0.84 cm LVOT diam:     2.00 cm LV SV:         63 LV SV Index:   38 LVOT Area:     3.14 cm  RIGHT VENTRICLE RV Basal diam:  2.54 cm LEFT ATRIUM           Index        RIGHT ATRIUM          Index LA diam:      3.10 cm 1.89 cm/m   RA Area:     8.89 cm LA Vol (A2C): 23.5 ml 14.32 ml/m  RA Volume:   15.40 ml 9.38 ml/m LA Vol (A4C): 19.5 ml 11.88 ml/m  AORTIC VALVE                    PULMONIC VALVE AV Area (Vmax):    2.77 cm     PV Vmax:          0.88 m/s AV Area (Vmean):   2.57 cm     PV Vmean:         59.800 cm/s AV Area (VTI):     2.90 cm     PV VTI:           0.156 m AV Vmax:           134.00 cm/s  PV Peak grad:  3.1 mmHg AV Vmean:          87.200 cm/s  PV Mean grad:     2.0 mmHg AV VTI:            0.218 m      PR End Diast Vel: 4.67 msec AV Peak Grad:      7.2 mmHg AV Mean Grad:      4.0 mmHg LVOT Vmax:         118.00 cm/s LVOT Vmean:        71.300 cm/s LVOT VTI:          0.201 m LVOT/AV VTI ratio: 0.92  AORTA Ao Root diam: 3.20 cm MITRAL VALVE MV Area (PHT): 4.96 cm     SHUNTS MV Area VTI:   2.08 cm     Systemic VTI:  0.20 m MV Peak grad:  7.0 mmHg     Systemic Diam: 2.00 cm MV Mean grad:  2.5 mmHg MV Vmax:       1.32 m/s MV Vmean:      74.8 cm/s MV Decel Time: 153  msec MV E velocity: 131.67 cm/s MV A velocity: 92.45 cm/s MV E/A ratio:  1.42 Ida Rogue MD Electronically signed by Ida Rogue MD Signature Date/Time: 04/12/2021/12:50:34 PM    Final    US THORACENTESIS ASP PLEURAL SPACE W/IMG GUIDE  Result Date: 04/12/2021 INDICATION: Patient increasing shortness of breath found to have AFib and bacterial pneumonia with a right sided pleural effusion. Request is for therapeutic and diagnostic right-sided thoracentesis EXAM: ULTRASOUND GUIDED DIAGNOSTIC AND THERAPEUTIC RIGHT-SIDED THORACENTESIS MEDICATIONS: Lidocaine 1% 10 mL COMPLICATIONS: None immediate. PROCEDURE: An ultrasound guided thoracentesis was thoroughly discussed with the patient and questions answered. The benefits, risks, alternatives and complications were also discussed. The patient understands and wishes to proceed with the procedure. Written consent was obtained. Ultrasound was performed to localize and mark an adequate pocket of fluid in the right chest. The area was then prepped and draped in the normal sterile fashion. 1% Lidocaine was used for local anesthesia. Under ultrasound guidance a 6 Fr Safe-T-Centesis catheter was introduced. Thoracentesis was performed. The catheter was removed and a dressing applied. FINDINGS: A total of approximately 50 mL of amber colored fluid was removed. Samples were sent to the laboratory as requested by the clinical team. A second pocket loculated phlegmon with noted to be in the right bases. Area in question too thick for thoracentesis. IMPRESSION: Successful ultrasound guided diagnostic and therapeutic right-sided thoracentesis yielding 50 mL of pleural fluid. Procedure performed and read by: Rushie Nyhan, IR NP Electronically Signed   By: Michaelle Birks M.D.   On: 04/12/2021 16:03    Component Ref Range & Units 1 d ago  Fluid Type-FCT  CYTO PLEU   Color, Fluid YELLOW ORANGE Abnormal    Appearance, Fluid CLEAR CLOUDY Abnormal    Total Nucleated Cell  Count, Fluid cu mm 5,842   Neutrophil Count, Fluid % 90   Lymphs, Fluid % 5   Monocyte-Macrophage-Serous Fluid % 5   Eos, Fluid % 0   Comment: Performed at [facility], [address] [place],  [address]  Resulting Agency  [facility] CLIN LAB   FINDINGS: A total of approximately 50 mL of amber colored fluid was removed. Samples were sent to the laboratory as requested by the clinical team. A second pocket loculated phlegmon with noted to be in the right bases. Area in question too thick for thoracentesis.   IMPRESSION: Successful ultrasound guided  diagnostic and therapeutic right-sided thoracentesis yielding 50 mL of pleural fluid.   Procedure performed and read by: Rushie Nyhan, IR NP      ASSESSMENT/PLAN    Severe community acquired pneumonia - present on admission  - COVID19 negative   - supplemental O2 during my evaluation -room air  -serum fungitell -legionella ab -strep pneumoniae ur AG -Histoplasma Ur Ag -sputum resp cultures-gram + cocci -no need for AFB sputum expectorated specimen -sputum cytology  -reviewed pertinent imaging with patient today - ESR/CRP ANA comprenesisve Procalcitonin trend -MRSA negative  -please encourage patient to use incentive spirometer few times each hour while hospitalized.   -s/p VZV infection       Right parapneumonic effusion -   patient is improved on room air in no distress and does have chest discomfort   - PMN predominant exudative effusion with negative gram stain and culture thus far   -peripheral blood film with leukemoid reaction suggestive of infection -patient is agreeable to chest tube      Atrial fibrillation with RVR    - Resolved   -s/p cardiology evaluation     Thank you for allowing me to participate in the care of this patient.   -patient has at least one life threatening severe medical condition which is being actively treated and managed during this evaluation.     Patient/Family are satisfied with care plan and all questions have been answered.  This document was prepared using Dragon voice recognition software and may include unintentional dictation errors.     Ottie Glazier, M.D.  Division of [place]

## 2021-04-15 NOTE — Progress Notes (Signed)
?  Transition of Care (TOC) Screening Note ? ? ?Patient Details  ?Name: Katherine Bradshaw ?Date of Birth: March 22, 1940 ? ? ?Transition of Care (TOC) CM/SW Contact:    ?Alberteen Sam, LCSW ?Phone Number: ?04/15/2021, 8:45 AM ? ? ? ?Transition of Care Department [facility]) has reviewed patient and no TOC needs have been identified at this time. We will continue to monitor patient advancement through interdisciplinary progression rounds. If new patient transition needs arise, please place a TOC consult. ? Pricilla Riffle, [place] ?[phone] ? ?

## 2021-04-15 NOTE — Assessment & Plan Note (Signed)
As above.

## 2021-04-15 NOTE — Consult Note (Signed)
Chief Complaint: Patient was seen in consultation today for loculated pleural effusion at the request of Dr. Roosevelt Locks  Referring Physician(s): Dr. Roosevelt Locks  Supervising Physician: Juliet Rude  Patient Status: [facility] - In-pt  History of Present Illness: Katherine Bradshaw is a 81 y.o. female who presented to ED on 3/9 after being seen at the [facility] for weakness, shortness of breath and right sided chest/abdominal pain, CT imaging done at [facility] showed right sided pleural effusion. Labs showed leukocytosis and patient being treated for pneumonia, on IV Unasyn. Patient is being followed by pulmonary and underwent thoracentesis on 3/10 which only yielded 11m secondary to loculations, PMN predominant with no growth on gram stain or culture. CT done today and reviewed by my attending, Dr. EDenna Haggardand loculated right effusion is amendable to pigtail drainage catheter placement. The patient states her shortness has improved some, she denies any continued right sided sharp chest pain. The patient denies any history of sleep apnea or chronic oxygen use. She has no known complications to sedation.   Past Medical History:  Diagnosis Date   [place]cer [place]    skin    Past Surgical History:  Procedure Laterality Date   BREAST BIOPSY Bilateral    neg   Allergies: Patient has no known allergies.  Medications: Prior to Admission medications   Medication Sig Start Date End Date Taking? Authorizing Provider  latanoprost (XALATAN) 0.005 % ophthalmic solution Place 1 drop into both eyes at bedtime.   Yes [provider]  levothyroxine (SYNTHROID) 75 MCG tablet Take 75 mcg by mouth every morning.   Yes [provider]  Multiple Vitamins-Minerals (MULTIVITAMIN WITH MINERALS) tablet Take 1 tablet by mouth daily.   Yes [provider]  pantoprazole (PROTONIX) 40 MG tablet Take 40 mg by mouth daily.   Yes [provider]  vitamin B-12 (CYANOCOBALAMIN) 1000 MCG tablet Take 1,000 mcg  by mouth daily.   Yes [provider]     Family History  Problem Relation Age of Onset   Breast cancer Sister 823   Social History   Socioeconomic History   Marital status: Widowed    Spouse name: Not on file   Number of children: Not on file   Years of education: Not on file   Highest education level: Not on file  Occupational History   Not on file  Tobacco Use   Smoking status: Never   Smokeless tobacco: Never  Substance and Sexual Activity   Alcohol use: Not Currently   Drug use: Never   Sexual activity: Not on file  Other Topics Concern   Not on file  Social History Narrative   Not on file   Social Determinants of Health   Financial Resource Strain: Not on file  Food Insecurity: Not on file  Transportation Needs: Not on file  Physical Activity: Not on file  Stress: Not on file  Social Connections: Not on file    Review of Systems: A 12 point ROS discussed and pertinent positives are indicated in the HPI above.  All other systems are negative.  Review of Systems  Vital Signs: BP (!) 141/52 (BP Location: Right Arm)    Pulse 78    Temp 97.9 F (36.6 C)    Resp 18    Ht '5\' 5"'$  (1.651 m)    Wt 128 lb 15.5 oz (58.5 kg)    SpO2 94%    BMI 21.46 kg/m   Physical Exam Constitutional:  General: She is not in acute distress. Cardiovascular:     Rate and Rhythm: Normal rate and regular rhythm.  Pulmonary:     Effort: Pulmonary effort is normal. No respiratory distress.  Skin:    General: Skin is warm and dry.  Neurological:     Mental Status: She is alert and oriented to person, place, and time.    Imaging: DG Chest 2 View  Result Date: 04/15/2021 CLINICAL DATA:  Parapneumonic effusion. EXAM: CHEST - 2 VIEW COMPARISON:  04/12/2021 and CT chest 03/05/2011. FINDINGS: Trachea is midline. Heart is enlarged. Thoracic aorta is calcified. Moderate to large loculated right pleural effusion with air-fluid levels. Collapse/consolidation in the right lung  base. Biapical pleural thickening. Trace left pleural effusion. IMPRESSION: 1. Moderate to large loculated right pleural effusion with air-fluid levels. Amount of air is greater than expected for recent thoracentesis. Empyema can have this appearance. Bronchopleural or alveolar pleural fistula cannot be excluded. These results will be called to the ordering clinician or representative by the Radiologist Assistant, and communication documented in the PACS or [employer]. 2. Right basilar collapse/consolidation, possibly due to pneumonia. Followup PA and lateral chest X-ray is recommended in 3-4 weeks following trial of antibiotic therapy to ensure resolution and exclude underlying malignancy. 3. Trace left pleural effusion. Electronically Signed   By: Lorin Picket M.D.   On: 04/15/2021 09:51   CT CHEST W CONTRAST  Result Date: 04/15/2021 CLINICAL DATA:  Empyema. EXAM: CT CHEST WITH CONTRAST TECHNIQUE: Multidetector CT imaging of the chest was performed during intravenous contrast administration. RADIATION DOSE REDUCTION: This exam was performed according to the departmental dose-optimization program which includes automated exposure control, adjustment of the mA and/or kV according to patient size and/or use of iterative reconstruction technique. CONTRAST:  40m OMNIPAQUE IOHEXOL 300 MG/ML  SOLN COMPARISON:  03/05/2011.  Abdomen pelvis CT 04/11/2021 FINDINGS: Cardiovascular: The heart size is normal. No substantial pericardial effusion. Mild atherosclerotic calcification is noted in the wall of the thoracic aorta. Mediastinum/Nodes: Mild mediastinal lymphadenopathy evident. 10 mm short axis AP window lymph node identified on 53/2. 14 mm short axis subcarinal lymph node evident. Mild lymphadenopathy noted right hilum. The esophagus has normal imaging features. There is no axillary lymphadenopathy. Lungs/Pleura: Loculated collection gas and fluid identified posterior right pleural space with loculated  component subpleural fluid seen laterally and anteriorly at the right base. Basilar atelectasis noted in the right middle and lower lobes. No suspicious nodule or mass on the left. No left pleural effusion. Upper Abdomen: Unremarkable. Musculoskeletal: No worrisome lytic or sclerotic osseous abnormality. IMPRESSION: 1. Loculated gas and fluid collection in the posterior right pleural space with loculated component seen laterally and anteriorly at the right base. Imaging features compatible with reported history of empyema. 2. Mild mediastinal and right hilar lymphadenopathy, likely reactive. 3. Aortic Atherosclerosis (ICD10-I70.0). Electronically Signed   By: EMisty StanleyM.D.   On: 04/15/2021 12:53   CT ABDOMEN PELVIS W CONTRAST  Result Date: 04/11/2021 CLINICAL DATA:  Right-sided abdominal pain EXAM: CT ABDOMEN AND PELVIS WITH CONTRAST TECHNIQUE: Multidetector CT imaging of the abdomen and pelvis was performed using the standard protocol following bolus administration of intravenous contrast. RADIATION DOSE REDUCTION: This exam was performed according to the departmental dose-optimization program which includes automated exposure control, adjustment of the mA and/or kV according to patient size and/or use of iterative reconstruction technique. CONTRAST:  1055mOMNIPAQUE IOHEXOL 300 MG/ML  SOLN COMPARISON:  None. FINDINGS: Lower chest: Loculated appearing small right pleural effusion is  partially imaged. There is adjacent atelectasis. Hepatobiliary: No focal liver abnormality is seen. No gallstones, gallbladder wall thickening, or biliary dilatation. Pancreas: Unremarkable. No pancreatic ductal dilatation or surrounding inflammatory changes. Spleen: Normal in size without focal abnormality. Adrenals/Urinary Tract: Adrenals are unremarkable. Too small to characterize low-density lesions of the right kidney. Bladder is unremarkable. Stomach/Bowel: Stomach is within normal limits. Bowel is normal in caliber.  Sigmoid diverticulosis. Vascular/Lymphatic: Atherosclerosis.  No enlarged nodes. Reproductive: Status post hysterectomy. No adnexal masses. Other: No free fluid.  No acute abnormality of the abdominal wall. Musculoskeletal: No acute osseous abnormality. IMPRESSION: Partially imaged loculated appearing small right pleural effusion. No acute abnormality in the abdomen or pelvis. Electronically Signed   By: Macy Mis M.D.   On: 04/11/2021 13:05   DG Chest Port 1 View  Result Date: 04/12/2021 CLINICAL DATA:  Post thoracentesis. EXAM: PORTABLE CHEST 1 VIEW COMPARISON:  Chest XR, 04/11/2021.  CT chest, 03/05/2011. FINDINGS: Cardiomediastinal silhouette is unchanged. LEFT lung is are well inflated. Interval resolution of small volume RIGHT pleural effusion, with trace residual. Residual RIGHT basilar hazy opacity, likely atelectasis. Mild blunting of the LEFT costophrenic angle. No pneumothorax. No acute osseous abnormality. IMPRESSION: 1. Interval resolution of small volume RIGHT pleural effusion, with trace residual. 2. No postprocedure pneumothorax. Electronically Signed   By: Michaelle Birks M.D.   On: 04/12/2021 15:18   DG Chest Port 1 View  Result Date: 04/11/2021 CLINICAL DATA:  Evaluate pleural effusion seen on CT. EXAM: PORTABLE CHEST 1 VIEW COMPARISON:  None. FINDINGS: Mild to moderate severity diffuse, chronic appearing increased interstitial lung markings are seen. Mild areas of atelectasis are noted within the bilateral lung bases. There are very small bilateral pleural effusions. No pneumothorax is identified. The heart size and mediastinal contours are within normal limits. There is marked severity calcification of the aortic arch. The visualized skeletal structures are unremarkable. IMPRESSION: 1. Chronic appearing increased interstitial lung markings. Mild, superimposed component of interstitial edema cannot be excluded. 2. Mild bibasilar atelectasis. 3. Very small bilateral pleural effusions.  Electronically Signed   By: Virgina Norfolk M.D.   On: 04/11/2021 18:59   ECHOCARDIOGRAM COMPLETE  Result Date: 04/12/2021    ECHOCARDIOGRAM REPORT   Patient Name:   Katherine Bradshaw Date of Exam: 04/12/2021 Medical Rec #:  [id number]       Height:       65.0 in Accession #:    [id number]      Weight:       129.0 lb Date of Birth:  04-03-1940       BSA:          1.642 m Patient Age:    65 years        BP:           146/57 mmHg Patient Gender: F               HR:           66 bpm. Exam Location:  [facility] Procedure: 2D Echo, Color Doppler and Cardiac Doppler Indications:     I48.91 Atrial fibrillation  History:         Patient has no prior history of Echocardiogram examinations.                  Signs/Symptoms:Shortness of Breath and Weakness.  Sonographer:     Charmayne Sheer Referring Phys:  [id number] AMY N COX Diagnosing Phys: Ida Rogue MD IMPRESSIONS  1. Left ventricular ejection fraction, by estimation,  is 60 to 65%. The left ventricle has normal function. The left ventricle has no regional wall motion abnormalities. Left ventricular diastolic parameters are consistent with Grade I diastolic dysfunction (impaired relaxation).  2. Right ventricular systolic function is normal. The right ventricular size is normal.  3. The mitral valve is normal in structure. Moderate mitral valve regurgitation. No evidence of mitral stenosis. There is mild late systolic prolapse of multiple scallops of the posterior leaflet of the mitral valve.  4. The aortic valve is tricuspid. Aortic valve regurgitation is not visualized. No aortic stenosis is present.  5. The inferior vena cava is normal in size with greater than 50% respiratory variability, suggesting right atrial pressure of 3 mmHg. FINDINGS  Left Ventricle: Left ventricular ejection fraction, by estimation, is 60 to 65%. The left ventricle has normal function. The left ventricle has no regional wall motion abnormalities. The left ventricular internal cavity size was normal in  size. There is  no left ventricular hypertrophy. Left ventricular diastolic parameters are consistent with Grade I diastolic dysfunction (impaired relaxation). Right Ventricle: The right ventricular size is normal. No increase in right ventricular wall thickness. Right ventricular systolic function is normal. Left Atrium: Left atrial size was normal in size. Right Atrium: Right atrial size was normal in size. Pericardium: There is no evidence of pericardial effusion. Mitral Valve: The mitral valve is normal in structure. There is mild late systolic prolapse of multiple scallops of the posterior leaflet of the mitral valve. Moderate mitral valve regurgitation. No evidence of mitral valve stenosis. MV peak gradient, 7.0 mmHg. The mean mitral valve gradient is 2.5 mmHg. Tricuspid Valve: The tricuspid valve is normal in structure. Tricuspid valve regurgitation is mild . No evidence of tricuspid stenosis. Aortic Valve: The aortic valve is tricuspid. Aortic valve regurgitation is not visualized. No aortic stenosis is present. Aortic valve mean gradient measures 4.0 mmHg. Aortic valve peak gradient measures 7.2 mmHg. Aortic valve area, by VTI measures 2.90 cm. Pulmonic Valve: The pulmonic valve was normal in structure. Pulmonic valve regurgitation is trivial. No evidence of pulmonic stenosis. Aorta: The aortic root is normal in size and structure. Venous: The inferior vena cava is normal in size with greater than 50% respiratory variability, suggesting right atrial pressure of 3 mmHg. IAS/Shunts: No atrial level shunt detected by color flow Doppler.  LEFT VENTRICLE PLAX 2D LVIDd:         3.71 cm   Diastology LVIDs:         2.32 cm   LV e' lateral:   8.86 cm/s LV PW:         1.03 cm   LV E/e' lateral: 14.9 LV IVS:        0.84 cm LVOT diam:     2.00 cm LV SV:         63 LV SV Index:   38 LVOT Area:     3.14 cm  RIGHT VENTRICLE RV Basal diam:  2.54 cm LEFT ATRIUM           Index        RIGHT ATRIUM          Index LA diam:       3.10 cm 1.89 cm/m   RA Area:     8.89 cm LA Vol (A2C): 23.5 ml 14.32 ml/m  RA Volume:   15.40 ml 9.38 ml/m LA Vol (A4C): 19.5 ml 11.88 ml/m  AORTIC VALVE  PULMONIC VALVE AV Area (Vmax):    2.77 cm     PV Vmax:          0.88 m/s AV Area (Vmean):   2.57 cm     PV Vmean:         59.800 cm/s AV Area (VTI):     2.90 cm     PV VTI:           0.156 m AV Vmax:           134.00 cm/s  PV Peak grad:     3.1 mmHg AV Vmean:          87.200 cm/s  PV Mean grad:     2.0 mmHg AV VTI:            0.218 m      PR End Diast Vel: 4.67 msec AV Peak Grad:      7.2 mmHg AV Mean Grad:      4.0 mmHg LVOT Vmax:         118.00 cm/s LVOT Vmean:        71.300 cm/s LVOT VTI:          0.201 m LVOT/AV VTI ratio: 0.92  AORTA Ao Root diam: 3.20 cm MITRAL VALVE MV Area (PHT): 4.96 cm     SHUNTS MV Area VTI:   2.08 cm     Systemic VTI:  0.20 m MV Peak grad:  7.0 mmHg     Systemic Diam: 2.00 cm MV Mean grad:  2.5 mmHg MV Vmax:       1.32 m/s MV Vmean:      74.8 cm/s MV Decel Time: 153 msec MV E velocity: 131.67 cm/s MV A velocity: 92.45 cm/s MV E/A ratio:  1.42 Ida Rogue MD Electronically signed by Ida Rogue MD Signature Date/Time: 04/12/2021/12:50:34 PM    Final    US THORACENTESIS ASP PLEURAL SPACE W/IMG GUIDE  Result Date: 04/12/2021 INDICATION: Patient increasing shortness of breath found to have AFib and bacterial pneumonia with a right sided pleural effusion. Request is for therapeutic and diagnostic right-sided thoracentesis EXAM: ULTRASOUND GUIDED DIAGNOSTIC AND THERAPEUTIC RIGHT-SIDED THORACENTESIS MEDICATIONS: Lidocaine 1% 10 mL COMPLICATIONS: None immediate. PROCEDURE: An ultrasound guided thoracentesis was thoroughly discussed with the patient and questions answered. The benefits, risks, alternatives and complications were also discussed. The patient understands and wishes to proceed with the procedure. Written consent was obtained. Ultrasound was performed to localize and mark an adequate pocket of  fluid in the right chest. The area was then prepped and draped in the normal sterile fashion. 1% Lidocaine was used for local anesthesia. Under ultrasound guidance a 6 Fr Safe-T-Centesis catheter was introduced. Thoracentesis was performed. The catheter was removed and a dressing applied. FINDINGS: A total of approximately 50 mL of amber colored fluid was removed. Samples were sent to the laboratory as requested by the clinical team. A second pocket loculated phlegmon with noted to be in the right bases. Area in question too thick for thoracentesis. IMPRESSION: Successful ultrasound guided diagnostic and therapeutic right-sided thoracentesis yielding 50 mL of pleural fluid. Procedure performed and read by: Rushie Nyhan, IR NP Electronically Signed   By: Michaelle Birks M.D.   On: 04/12/2021 16:03    Labs:  CBC: Recent Labs    04/12/21 0543 04/13/21 0453 04/14/21 0503 04/15/21 0516  WBC 60.8* 35.0* 25.7* 26.1*  HGB 9.9* 9.9* 10.0* 10.1*  HCT 30.2* 29.7* 30.1* 30.9*  PLT 385 417* 463* 526*  COAGS: Recent Labs    04/12/21 0543  INR 1.3*  APTT 56*    BMP: Recent Labs    04/12/21 0543 04/13/21 0453 04/14/21 0503 04/15/21 0516  NA 127* 134* 135 133*  K 3.9 3.3* 3.3* 4.5  CL 99 101 100 103  CO2 21* '28 29 26  '$ GLUCOSE 106* 98 110* 120*  BUN '12 11 12 10  '$ CALCIUM 7.9* 7.8* 8.0* 7.9*  CREATININE 0.64 0.70 0.71 0.73  GFRNONAA >60 >60 >60 >60    LIVER FUNCTION TESTS: Recent Labs    04/15/21 0516  BILITOT 0.7  AST 12*  ALT 12  ALKPHOS 100  PROT 6.4*  ALBUMIN 2.1*    Assessment and Plan: This is a 81 year old female who presented to ED on 3/9 after being seen at the [facility] for weakness, shortness of breath and right sided chest/abdominal pain, CT imaging done at [facility] showed right sided pleural effusion. Labs showed leukocytosis and patient being treated for pneumonia, on IV Unasyn. Patient is being followed by pulmonary and underwent thoracentesis on 3/10 which only yielded  21m secondary to loculations, PMN predominant with no growth on gram stain or culture. CT done today and reviewed by my attending, Dr. EDenna Haggardand loculated right effusion is amendable to pigtail drainage catheter placement.   The patient will be NPO after midnight, labs and vitals have been reviewed.  Risks and benefits of image guided right pleural pigtail drainage catheter placement with moderate sedation was discussed with the patient and/or patient's family including, but not limited to bleeding, infection, or damage to adjacent structures.  All of the questions were answered and there is agreement to proceed.  Consent signed and in chart.  Thank you for this interesting consult.  I greatly enjoyed meeting Katherine FRITTSand look forward to participating in their care.  A copy of this report was sent to the requesting provider on this date.  Electronically Signed: MHedy Jacob PA-C 04/15/2021, 3:24 PM   I spent a total of 20 Minutes in face to face in clinical consultation, greater than 50% of which was counseling/coordinating care for loculated right pleural effusion.

## 2021-04-16 ENCOUNTER — Inpatient Hospital Stay: Payer: PPO

## 2021-04-16 DIAGNOSIS — A419 Sepsis, unspecified organism: Secondary | ICD-10-CM | POA: Diagnosis not present

## 2021-04-16 DIAGNOSIS — J918 Pleural effusion in other conditions classified elsewhere: Secondary | ICD-10-CM | POA: Diagnosis not present

## 2021-04-16 DIAGNOSIS — J189 Pneumonia, unspecified organism: Secondary | ICD-10-CM | POA: Diagnosis not present

## 2021-04-16 DIAGNOSIS — J154 Pneumonia due to other streptococci: Secondary | ICD-10-CM | POA: Diagnosis not present

## 2021-04-16 LAB — CBC WITH DIFFERENTIAL/PLATELET
Abs Immature Granulocytes: 1.75 10*3/uL — ABNORMAL HIGH (ref 0.00–0.07)
Basophils Absolute: 0.1 10*3/uL (ref 0.0–0.1)
Basophils Relative: 0 %
Eosinophils Absolute: 0.4 10*3/uL (ref 0.0–0.5)
Eosinophils Relative: 2 %
HCT: 32.1 % — ABNORMAL LOW (ref 36.0–46.0)
Hemoglobin: 10.6 g/dL — ABNORMAL LOW (ref 12.0–15.0)
Immature Granulocytes: 8 %
Lymphocytes Relative: 12 %
Lymphs Abs: 2.6 10*3/uL (ref 0.7–4.0)
MCH: 28.4 pg (ref 26.0–34.0)
MCHC: 33 g/dL (ref 30.0–36.0)
MCV: 86.1 fL (ref 80.0–100.0)
Monocytes Absolute: 1.8 10*3/uL — ABNORMAL HIGH (ref 0.1–1.0)
Monocytes Relative: 8 %
Neutro Abs: 15.4 10*3/uL — ABNORMAL HIGH (ref 1.7–7.7)
Neutrophils Relative %: 70 %
Platelets: 554 10*3/uL — ABNORMAL HIGH (ref 150–400)
RBC: 3.73 MIL/uL — ABNORMAL LOW (ref 3.87–5.11)
RDW: 13.9 % (ref 11.5–15.5)
Smear Review: NORMAL
WBC: 22 10*3/uL — ABNORMAL HIGH (ref 4.0–10.5)
nRBC: 0 % (ref 0.0–0.2)

## 2021-04-16 LAB — BASIC METABOLIC PANEL
Anion gap: 6 (ref 5–15)
BUN: 7 mg/dL — ABNORMAL LOW (ref 8–23)
CO2: 27 mmol/L (ref 22–32)
Calcium: 7.9 mg/dL — ABNORMAL LOW (ref 8.9–10.3)
Chloride: 100 mmol/L (ref 98–111)
Creatinine, Ser: 0.7 mg/dL (ref 0.44–1.00)
GFR, Estimated: >60 mL/min (ref >60)
Glucose, Bld: 108 mg/dL — ABNORMAL HIGH (ref 70–99)
Potassium: 3.5 mmol/L (ref 3.5–5.1)
Sodium: 133 mmol/L — ABNORMAL LOW (ref 135–145)

## 2021-04-16 LAB — CULTURE, BLOOD (ROUTINE X 2): Culture: NO GROWTH

## 2021-04-16 LAB — BODY FLUID CELL COUNT WITH DIFFERENTIAL
Eos, Fluid: 0 %
Lymphs, Fluid: 10 %
Monocyte-Macrophage-Serous Fluid: 0 %
Neutrophil Count, Fluid: 90 %
Total Nucleated Cell Count, Fluid: 774 cu mm

## 2021-04-16 LAB — CULTURE, RESPIRATORY W GRAM STAIN
Culture: NORMAL
Gram Stain: NONE SEEN

## 2021-04-16 LAB — BODY FLUID CULTURE W GRAM STAIN: Culture: NO GROWTH

## 2021-04-16 LAB — MAGNESIUM: Magnesium: 2.1 mg/dL (ref 1.7–2.4)

## 2021-04-16 LAB — FUNGITELL, SERUM: Fungitell Result: <31 pg/mL (ref <80)

## 2021-04-16 LAB — LEGIONELLA PNEUMOPHILA TOTAL AB: Legionella Pneumo Total Ab: <0.91 OD ratio (ref 0.00–0.90)

## 2021-04-16 LAB — CYTOLOGY - NON PAP

## 2021-04-16 MED ORDER — FENTANYL CITRATE (PF) 100 MCG/2ML IJ SOLN
INTRAMUSCULAR | Status: AC
  Filled 2021-04-16: qty 2

## 2021-04-16 MED ORDER — FENTANYL CITRATE (PF) 100 MCG/2ML IJ SOLN
INTRAMUSCULAR | Status: DC | PRN
  Administered 2021-04-16: 50 ug via INTRAVENOUS

## 2021-04-16 MED ORDER — MIDAZOLAM HCL 2 MG/2ML IJ SOLN
INTRAMUSCULAR | Status: DC | PRN
  Administered 2021-04-16: 1 mg via INTRAVENOUS

## 2021-04-16 MED ORDER — APIXABAN 2.5 MG PO TABS
2.5000 mg | ORAL_TABLET | Freq: Two times a day (BID) | ORAL | Status: DC
  Administered 2021-04-17 – 2021-04-19 (×4): 2.5 mg via ORAL
  Filled 2021-04-16 (×4): qty 1

## 2021-04-16 MED ORDER — MIDAZOLAM HCL 2 MG/2ML IJ SOLN
INTRAMUSCULAR | Status: AC
  Filled 2021-04-16: qty 2

## 2021-04-16 MED ORDER — OXYCODONE-ACETAMINOPHEN 5-325 MG PO TABS
0.5000 | ORAL_TABLET | ORAL | Status: DC | PRN
  Administered 2021-04-18 – 2021-04-19 (×5): 0.5 via ORAL
  Filled 2021-04-16 (×5): qty 1

## 2021-04-16 NOTE — Progress Notes (Signed)
?Progress Note ? ? ?Patient: Katherine Bradshaw ZOX:[id number] DOB: May 04, 1940 DOA: 04/11/2021     4 ?DOS: the patient was seen and examined on 04/16/2021 ?  ?Brief hospital course: ?Ms. Noland Fordyce Abdelrahman is a 81 year old female with medical history of hypothyroid, GERD, who presents to the emergency department from [facility] clinic for chief concerns of shortness of breath, right lower quadrant pain, weakness. ?Patient had shingles about a month ago, had upper respiratory infection for the last week.  Started have a cough and shortness of breath.  Also had right lower quadrant abdominal pain, which he has resolved after 2 episode of diarrhea at early morning of 3/10. ?Upon arriving the hospital, she had severe leukocytosis with white cell count 72.2, reactive thrombocytosis, lactic acidosis of 2.7.  Sodium 123.  Her vital signs showed atrial fibrillation with RVR 126, tachypnea.  She was placed on broad-spectrum antibiotics for severe sepsis.  Her CT scan of abdomen/pelvis did not show any acute changes.  Lower chest had a small loculated pleural effusion.  However on chest x-ray, there is very small amount of pleural effusion. ?Thoracentesis was performed on 3/10, removed 50 mL of fluids.  LDH 3085, neutrophil 90% with total white cell 5842. ? ?3/12.  Condition improving, antibiotic changed to Unasyn. ?3/13.  Repeated chest x-ray showed reaccumulation of right-sided pleural effusion, CT scan suspect empyema.  Chest tube is placed 3/14.  ? ?Assessment and Plan: ?* New onset atrial fibrillation ([place]) ?Chadsvasc score is 4, patient is back to sinus. ?Eliquis is on hold due to chest tube placement.  May restart tomorrow. ? ? ?Pneumonia due to streptococcus, group A ([place]) ?Positive ASO, likely group A strep pneumonia. ?Continue on Unasyn. ? ?Iron deficiency anemia ?Continue oral iron ? ?Acute on chronic diastolic CHF (congestive heart failure) ([place]) ?Echocardiogram showed normal ejection fraction, elevated BNP probably from  volume.  She received IV Lasix. ?No need for additional Lasix ? ?Reactive thrombocytosis ?Continue to follow ? ?Secondary bacterial pneumonia ?Patient had a recent viral infection, followed by cough and shortness of breath.  Procalcitonin level elevated.  This is a postviral bacterial pneumonia.   ?Positive ASO, likely group A strep pneumonia. ?Continue on Unasyn for now. ? ? ?Severe sepsis ([place]) ?Patient met severe sepsis criteria with severe leukocytosis, tachypnea and tachycardia.  Patient also has elevated lactic acid.  Blood cultures are negative.  Condition has improved. ? ?Hypothyroidism ?Continue Synthroid. ? ?Leukemoid reaction ?Patient initial white cell count was 86,000, patient was initially evaluated by hematology, flow cytometry did not show any evidence of malignancy.  This appears to be secondary to severe sepsis.  Condition has improved. ? ?Loculated pleural effusion ?As above. ? ?Hyponatremia ?Improved ? ?Parapneumonic effusion ?Thoracentesis culture negative. ?CT chest suspect empyema.  Chest tube will be placed today.  Follow-up on lab results. ?Continue Unasyn. ? ? ?Acute CHF ([facility])-resolved as of 04/13/2021 ?Patient condition is consistent with new onset congestive heart failure. ?She had a recent viral infection, consider the possibility of myocarditis versus other cause of congestive heart failure. ?Reviewed the EKG, showed atrial fibrillation with T wave changes. ?Check a troponin and echocardiogram. ?Patient is hemodynamically stable now, will start IV Lasix at 40 mg daily.  Monitor electrolytes. ? ? ? ? ?  ? ?Subjective:  ?Patient feels much improved today, reporting no short of breath or cough. ?No fever or chills. ?She has some loose stools, but better than last few days.  No nausea vomiting or abdominal pain. ? ?Physical Exam: ?Vitals:  ?  04/16/21 0100 04/16/21 0320 04/16/21 0740 04/16/21 1113  ?BP:  115/66 (!) 126/48 (!) 120/48  ?Pulse:  82 77 79  ?Resp:  '18 16 16  ' ?Temp:  98.2 ?F (36.8  ?C) 98.3 ?F (36.8 ?C) 98.2 ?F (36.8 ?C)  ?TempSrc:      ?SpO2:  93% 95% 94%  ?Weight: 57.2 kg     ?Height: '5\' 5"'  (1.651 m)     ? ?General exam: Appears calm and comfortable  ?Respiratory system: Decreased breathing sounds and crackles in the right base. Respiratory effort normal. ?Cardiovascular system: S1 & S2 heard, RRR. No JVD, murmurs, rubs, gallops or clicks. No pedal edema. ?Gastrointestinal system: Abdomen is nondistended, soft and nontender. No organomegaly or masses felt. Normal bowel sounds heard. ?Central nervous system: Alert and oriented. No focal neurological deficits. ?Extremities: Symmetric 5 x 5 power. ?Skin: No rashes, lesions or ulcers ?Psychiatry: Judgement and insight appear normal. Mood & affect appropriate.  ? ?Data Reviewed: ?Reviewed the CT chest, x-ray and all lab results. ? ?Family Communication:  ? ?Disposition: ?Status is: Inpatient ?Remains inpatient appropriate because: Severity of disease, procedure, IV antibiotics. ? Planned Discharge Destination: Home with Home Health ? ? ? ?Time spent: 34 minutes ? ?Author: ?Sharen Hones, MD ?04/16/2021 12:12 PM ? ?For on call review [email or web address].  ?

## 2021-04-16 NOTE — Progress Notes (Signed)
? ? ? ?PULMONOLOGY ? ? ? ? ? ? ? ? ?Date: 04/16/2021,   ?MRN# [id number] Katherine Bradshaw [birth year] ? ? ?  ?AdmissionWeight: 57.2 kg                 ?CurrentWeight: 57.2 kg ? ? ?Referring physician: Dr Roosevelt Locks ? ? ?CHIEF COMPLAINT:  ? ?Secondary bacterial pneumonia with bilateral effusions ? ? ?HISTORY OF PRESENT ILLNESS  ? ?81 year old female with a history of GERD and hypothyroidism who came from [facility] for dyspnea on exertion and shortness of breath at rest right-sided chest discomfort.  Presented to the ED with tachycardia mild hypotension and mild anemia.  Head CT imaging performed with pleural effusion noted.  In the ED developed atrial fibrillation with rapid ventricular response and received IV therapy including metoprolol and amiodarone with subsequent improvement.  Patient was seen by cardiology with additional medications delivered for arrhythmia.  Interventional radiology consultation placed for thoracentesis with pleural fluid studies ordered.  Empirically treated for pneumonia with vancomycin and cefepime as well as Flagyl received resuscitative fluids with LR.  Oncology consultation for severe leucocytosis.  ? ?Patient shares since March 5th on Sunday she developed sore throat and cough and then she got myalgia and malaise. She is has never smoked. She had sister with breast cancer who passed away and anther sister with breast cancer who survived. She reports severe fatigue for at least 5 years progressively worsening.  ? ? ?04/14/21- patient is substantially improved and bloodwork is improved in paralell. Granddaughter Katherine Bradshaw was present during interview and physical examination reports feeling well. She is on room air.  ?04/15/21-patient is stable, I met with Son Katherine Bradshaw to review serial chest imaging. She is agreeable to chest tube, she is NPO post midnight, shes of eliquis.   Gram + cocci on resp culture ? ?04/16/21- patient did well with no overnight events. Resting in chair smiling during  interview and physical examination. ? ? ?PAST MEDICAL HISTORY  ? ?Past Medical History:  ?Diagnosis Date  ? Cancer [facility])   ? skin  ? ? ? ?SURGICAL HISTORY  ? ?Past Surgical History:  ?Procedure Laterality Date  ? BREAST BIOPSY Bilateral   ? neg  ? ? ? ?FAMILY HISTORY  ? ?Family History  ?Problem Relation Age of Onset  ? Breast cancer Sister 36  ? ? ? ?SOCIAL HISTORY  ? ?Social History  ? ?Tobacco Use  ? Smoking status: Never  ? Smokeless tobacco: Never  ?Substance Use Topics  ? Alcohol use: Not Currently  ? Drug use: Never  ? ? ? ?MEDICATIONS  ? ? ?Home Medication:  ?  ?Current Medication: ? ?Current Facility-Administered Medications:  ?  0.9 %  sodium chloride infusion, , Intravenous, PRN, Sharen Hones, MD, Stopped at 04/15/21 0450 ?  acetaminophen (TYLENOL) tablet 650 mg, 650 mg, Oral, Q6H PRN **OR** acetaminophen (TYLENOL) suppository 650 mg, 650 mg, Rectal, Q6H PRN, Cox, Amy N, DO ?  amiodarone (PACERONE) tablet 200 mg, 200 mg, Oral, BID, Sharen Hones, MD, 200 mg at 04/15/21 2225 ?  Ampicillin-Sulbactam (UNASYN) 3 g in sodium chloride 0.9 % 100 mL IVPB, 3 g, Intravenous, Q6H, Sharen Hones, MD, Last Rate: 200 mL/hr at 04/16/21 0331, 3 g at 04/16/21 0331 ?  diltiazem (CARDIZEM SR) 12 hr capsule 120 mg, 120 mg, Oral, Q12H, Sharen Hones, MD, 120 mg at 04/15/21 2225 ?  iron polysaccharides (NIFEREX) capsule 150 mg, 150 mg, Oral, Daily, Sharen Hones, MD, 150 mg at 04/15/21 1001 ?  latanoprost (XALATAN) 0.005 % ophthalmic solution 1 drop, 1 drop, Both Eyes, QHS, Cox, Amy N, DO, 1 drop at 04/15/21 2225 ?  levothyroxine (SYNTHROID) tablet 75 mcg, 75 mcg, Oral, Q0600, Cox, Amy N, DO, 75 mcg at 04/16/21 0552 ?  loperamide (IMODIUM) capsule 2 mg, 2 mg, Oral, PRN, Sharen Hones, MD ?  melatonin tablet 5 mg, 5 mg, Oral, QHS PRN, Foust, Katy L, NP, 5 mg at 04/14/21 2206 ?  metoprolol tartrate (LOPRESSOR) injection 5 mg, 5 mg, Intravenous, Q6H PRN, Sharen Hones, MD, 5 mg at 04/13/21 0941 ?  multivitamin with minerals tablet 1  tablet, 1 tablet, Oral, Daily, Cox, Amy N, DO, 1 tablet at 04/15/21 1001 ?  ondansetron (ZOFRAN) tablet 4 mg, 4 mg, Oral, Q6H PRN **OR** ondansetron (ZOFRAN) injection 4 mg, 4 mg, Intravenous, Q6H PRN, Cox, Amy N, DO ?  pantoprazole (PROTONIX) EC tablet 40 mg, 40 mg, Oral, Daily, Cox, Amy N, DO, 40 mg at 04/15/21 1000 ?  sodium chloride flush (NS) 0.9 % injection 3 mL, 3 mL, Intravenous, Q12H, Sharen Hones, MD, 3 mL at 04/15/21 2229 ?  vitamin B-12 (CYANOCOBALAMIN) tablet 1,000 mcg, 1,000 mcg, Oral, Daily, Cox, Amy N, DO, 1,000 mcg at 04/15/21 1001 ? ? ? ?ALLERGIES  ? ?Patient has no known allergies. ? ? ? ? ?REVIEW OF SYSTEMS  ? ? ?Review of Systems: ? ?Gen:  Denies  fever, sweats, chills weigh loss  ?HEENT: Denies blurred vision, double vision, ear pain, eye pain, hearing loss, nose bleeds, sore throat ?Cardiac:  No dizziness, chest pain or heaviness, chest tightness,edema ?Resp:   Denies cough or sputum porduction, shortness of breath,wheezing, hemoptysis,  ?Gi: Denies swallowing difficulty, stomach pain, nausea or vomiting, diarrhea, constipation, bowel incontinence ?Gu:  Denies bladder incontinence, burning urine ?Ext:   Denies Joint pain, stiffness or swelling ?Skin: Denies  skin rash, easy bruising or bleeding or hives ?Endoc:  Denies polyuria, polydipsia , polyphagia or weight change ?Psych:   Denies depression, insomnia or hallucinations  ? ?Other:  All other systems negative ? ? ?VS: BP (!) 126/48 (BP Location: Right Arm)   Pulse 77   Temp 98.3 ?F (36.8 ?C)   Resp 16   Ht '5\' 5"'  (1.651 m)   Wt 57.2 kg   SpO2 95%   BMI 20.98 kg/m?   ? ? ? ?PHYSICAL EXAM  ? ? ?GENERAL:NAD, no fevers, chills, no weakness no fatigue ?HEAD: Normocephalic, atraumatic.  ?EYES: Pupils equal, round, reactive to light. Extraocular muscles intact. No scleral icterus.  ?MOUTH: Moist mucosal membrane. Dentition intact. No abscess noted.  ?EAR, NOSE, THROAT: Clear without exudates. No external lesions.  ?NECK: Supple. No  thyromegaly. No nodules. No JVD.  ?PULMONARY: mild rhonchi  ?CARDIOVASCULAR: S1 and S2. Regular rate and rhythm. No murmurs, rubs, or gallops. No edema. Pedal pulses 2+ bilaterally.  ?GASTROINTESTINAL: Soft, nontender, nondistended. No masses. Positive bowel sounds. No hepatosplenomegaly.  ?MUSCULOSKELETAL: No swelling, clubbing, or edema. Range of motion full in all extremities.  ?NEUROLOGIC: Cranial nerves II through XII are intact. No gross focal neurological deficits. Sensation intact. Reflexes intact.  ?SKIN: No ulceration, lesions, rashes, or cyanosis. Skin warm and dry. Turgor intact.  ?PSYCHIATRIC: Mood, affect within normal limits. The patient is awake, alert and oriented x 3. Insight, judgment intact.  ? ? ?  ? ?IMAGING  ? ? ?DG Chest 2 View ? ?Result Date: 04/15/2021 ?CLINICAL DATA:  Parapneumonic effusion. EXAM: CHEST - 2 VIEW COMPARISON:  04/12/2021 and CT chest 03/05/2011. FINDINGS: Trachea  is midline. Heart is enlarged. Thoracic aorta is calcified. Moderate to large loculated right pleural effusion with air-fluid levels. Collapse/consolidation in the right lung base. Biapical pleural thickening. Trace left pleural effusion. IMPRESSION: 1. Moderate to large loculated right pleural effusion with air-fluid levels. Amount of air is greater than expected for recent thoracentesis. Empyema can have this appearance. Bronchopleural or alveolar pleural fistula cannot be excluded. These results will be called to the ordering clinician or representative by the Radiologist Assistant, and communication documented in the PACS or [employer]. 2. Right basilar collapse/consolidation, possibly due to pneumonia. Followup PA and lateral chest X-ray is recommended in 3-4 weeks following trial of antibiotic therapy to ensure resolution and exclude underlying malignancy. 3. Trace left pleural effusion. Electronically Signed   By: Lorin Picket M.D.   On: 04/15/2021 09:51  ? ?CT CHEST W CONTRAST ? ?Result Date:  04/15/2021 ?CLINICAL DATA:  Empyema. EXAM: CT CHEST WITH CONTRAST TECHNIQUE: Multidetector CT imaging of the chest was performed during intravenous contrast administration. RADIATION DOSE REDUCTION: This exam was performed according

## 2021-04-16 NOTE — Procedures (Signed)
Pre procedural Dx: Right chest empyema ?Post procedural Dx: Same ? ?Technically successful CT guided placed of a 14 Fr drainage catheter placement into the basilar aspect of right pleural effusion yielding 80 cc of slightly dark serous fluid.   ?A representative aspirated sample was capped and sent to the laboratory for analysis.   ?Chest tube connected to Pleur-Vac device. ? ?EBL: Trace ?Complications: None immediate ? ?Ronny Bacon, MD ?Pager #: [phone] ? ? ?

## 2021-04-16 NOTE — Progress Notes (Signed)
PT Cancellation Note ? ?Patient Details ?Name: LEONILDA COZBY ?MRN: [id number] ?DOB: [birth year] ? ? ?Cancelled Treatment:    Reason Eval/Treat Not Completed: Patient at procedure or test/unavailable. Char reviewed and attempt made to visit pt. Per secretary pt off floor for chest tube placement. Will re-attempt at later time/date. ? ? ?Salem Caster. Fairly IV, PT, DPT ?Physical Therapist- [place]  ?[facility]  ?04/16/2021, 1:20 PM ?

## 2021-04-16 NOTE — Consult Note (Signed)
ANTICOAGULATION CONSULT NOTE ? ?Pharmacy Consult for Apixaban ?Indication: atrial fibrillation ? ?Patient Measurements: ?Height: '5\' 5"'$  (165.1 cm) ?Weight: 57.2 kg (126 lb 1.7 oz) ?IBW/kg (Calculated) : 57 ? ?Labs: ?Recent Labs  ?  04/14/21 ?0503 04/15/21 ?2751 04/16/21 ?0541  ?HGB 10.0* 10.1* 10.6*  ?HCT 30.1* 30.9* 32.1*  ?PLT 463* 526* 554*  ?CREATININE 0.71 0.73 0.70  ? ? ? ?Estimated Creatinine Clearance: 48.8 mL/min (by C-G formula based on SCr of 0.7 mg/dL). ? ? ?Medical History: ?Past Medical History:  ?Diagnosis Date  ? Cancer [facility])   ? skin  ? ? ?Medications:  ?No anticoagulation prior to admission per my chart review ? ?Assessment: ?Patient is a 81 y/o F with medical history including hypothyroidism, GERD who presented to the ED from [facility] for shortness of breath, RLQ pain, and weakness and was ultimately admitted for new-onset Afib with RVR, bacterial pneumonia c/b loculated pleural effusion, and acute CHF. Chest tube placed 3/14. Pharmacy consulted to restart apixaban. ? ?Bleeding risk post-procedure: standard ? ?Plan:  ?--Start apixaban 2.5 mg BID (age > 80, Wt < 60 kg) in the evening (on the day after procedure). ?--CBC at least every 3 days per protocol ? ?Wynelle Cleveland, PharmD ?Pharmacy Resident  ?04/16/2021 ?2:26 PM ? ? ? ?

## 2021-04-16 NOTE — TOC Progression Note (Signed)
Transition of Care (TOC) - Progression Note  ? ? ?Patient Details  ?Name: ZARAI ORSBORN ?MRN: [id number] ?Date of Birth: August 11, 1940 ? ?Transition of Care (TOC) CM/SW Contact  ?Kerin Salen, RN ?Phone Number: ?04/16/2021, 1:55 PM ? ?Clinical Narrative: NMS, for CT placement today possible discharge 3 days. TOC to continue to track.  ? ? ? ?  ?  ? ?Expected Discharge Plan and Services ?  ?  ?  ?  ?  ?                ?  ?  ?  ?  ?  ?  ?  ?  ?  ?  ? ? ?Social Determinants of Health (SDOH) Interventions ?  ? ?Readmission Risk Interventions ?No flowsheet data found. ? ?

## 2021-04-17 ENCOUNTER — Other Ambulatory Visit (HOSPITAL_COMMUNITY): Payer: Self-pay

## 2021-04-17 ENCOUNTER — Inpatient Hospital Stay: Payer: PPO

## 2021-04-17 DIAGNOSIS — D75838 Other thrombocytosis: Secondary | ICD-10-CM

## 2021-04-17 DIAGNOSIS — D509 Iron deficiency anemia, unspecified: Secondary | ICD-10-CM

## 2021-04-17 DIAGNOSIS — D72823 Leukemoid reaction: Secondary | ICD-10-CM

## 2021-04-17 DIAGNOSIS — I4891 Unspecified atrial fibrillation: Secondary | ICD-10-CM | POA: Diagnosis not present

## 2021-04-17 DIAGNOSIS — J189 Pneumonia, unspecified organism: Secondary | ICD-10-CM | POA: Diagnosis not present

## 2021-04-17 DIAGNOSIS — E039 Hypothyroidism, unspecified: Secondary | ICD-10-CM

## 2021-04-17 DIAGNOSIS — A419 Sepsis, unspecified organism: Secondary | ICD-10-CM | POA: Diagnosis not present

## 2021-04-17 LAB — GLUCOSE, BODY FLUID OTHER: Glucose, Body Fluid Other: 34 mg/dL

## 2021-04-17 LAB — BASIC METABOLIC PANEL
Anion gap: 4 — ABNORMAL LOW (ref 5–15)
BUN: 10 mg/dL (ref 8–23)
CO2: 30 mmol/L (ref 22–32)
Calcium: 7.6 mg/dL — ABNORMAL LOW (ref 8.9–10.3)
Chloride: 97 mmol/L — ABNORMAL LOW (ref 98–111)
Creatinine, Ser: 0.59 mg/dL (ref 0.44–1.00)
GFR, Estimated: >60 mL/min (ref >60)
Glucose, Bld: 100 mg/dL — ABNORMAL HIGH (ref 70–99)
Potassium: 3.1 mmol/L — ABNORMAL LOW (ref 3.5–5.1)
Sodium: 131 mmol/L — ABNORMAL LOW (ref 135–145)

## 2021-04-17 LAB — CULTURE, BLOOD (ROUTINE X 2): Culture: NO GROWTH

## 2021-04-17 LAB — CBC WITH DIFFERENTIAL/PLATELET
Abs Immature Granulocytes: 1.29 10*3/uL — ABNORMAL HIGH (ref 0.00–0.07)
Basophils Absolute: 0.1 10*3/uL (ref 0.0–0.1)
Basophils Relative: 0 %
Eosinophils Absolute: 0.6 10*3/uL — ABNORMAL HIGH (ref 0.0–0.5)
Eosinophils Relative: 4 %
HCT: 31.6 % — ABNORMAL LOW (ref 36.0–46.0)
Hemoglobin: 10.4 g/dL — ABNORMAL LOW (ref 12.0–15.0)
Immature Granulocytes: 10 %
Lymphocytes Relative: 18 %
Lymphs Abs: 2.4 10*3/uL (ref 0.7–4.0)
MCH: 28 pg (ref 26.0–34.0)
MCHC: 32.9 g/dL (ref 30.0–36.0)
MCV: 85.2 fL (ref 80.0–100.0)
Monocytes Absolute: 1.2 10*3/uL — ABNORMAL HIGH (ref 0.1–1.0)
Monocytes Relative: 9 %
Neutro Abs: 8.1 10*3/uL — ABNORMAL HIGH (ref 1.7–7.7)
Neutrophils Relative %: 59 %
Platelets: 611 10*3/uL — ABNORMAL HIGH (ref 150–400)
RBC: 3.71 MIL/uL — ABNORMAL LOW (ref 3.87–5.11)
RDW: 14 % (ref 11.5–15.5)
Smear Review: NORMAL
WBC: 13.5 10*3/uL — ABNORMAL HIGH (ref 4.0–10.5)
nRBC: 0 % (ref 0.0–0.2)

## 2021-04-17 LAB — IMMUNOGLOBULINS A/E/G/M, SERUM
IgA: 314 mg/dL (ref 64–422)
IgE (Immunoglobulin E), Serum: 11 IU/mL (ref 6–495)
IgG (Immunoglobin G), Serum: 1184 mg/dL (ref 586–1602)
IgM (Immunoglobulin M), Srm: 66 mg/dL (ref 26–217)

## 2021-04-17 LAB — PROTEIN, BODY FLUID (OTHER): Total Protein, Body Fluid Other: 3.9 g/dL

## 2021-04-17 LAB — LD, BODY FLUID (OTHER): LD, Body Fluid: 3711 IU/L

## 2021-04-17 LAB — MAGNESIUM: Magnesium: 2.3 mg/dL (ref 1.7–2.4)

## 2021-04-17 MED ORDER — POTASSIUM CHLORIDE CRYS ER 20 MEQ PO TBCR
40.0000 meq | EXTENDED_RELEASE_TABLET | Freq: Once | ORAL | Status: AC
  Administered 2021-04-17: 40 meq via ORAL
  Filled 2021-04-17: qty 2

## 2021-04-17 NOTE — Progress Notes (Signed)
? ? ?Referring Physician(s): ?Ottie Glazier, MD ? ?Supervising Physician: Juliet Rude ? ?Patient Status:  [place] - In-pt ? ?Reason for visit: ?81 year old female with right loculated pleural effusion concern for empyema ?S/p successful thoracentesis 3/10 yielding 50 ml, Cx no growth ?S/p successful placement of 61F percutaneous pigtail drainage catheter into right hydroPTX into the basilar aspect 3/14 ? ?Subjective: ?The patient states she is feeling good today, improvement in shortness of breath and denies any complaints at chest tube site. ? ?Allergies: ?Patient has no known allergies. ? ?Medications: ?Prior to Admission medications   ?Medication Sig Start Date End Date Taking? Authorizing Provider  ?latanoprost (XALATAN) 0.005 % ophthalmic solution Place 1 drop into both eyes at bedtime.   Yes [provider]  ?levothyroxine (SYNTHROID) 75 MCG tablet Take 75 mcg by mouth every morning.   Yes [provider]  ?Multiple Vitamins-Minerals (MULTIVITAMIN WITH MINERALS) tablet Take 1 tablet by mouth daily.   Yes [provider]  ?pantoprazole (PROTONIX) 40 MG tablet Take 40 mg by mouth daily.   Yes [provider]  ?vitamin B-12 (CYANOCOBALAMIN) 1000 MCG tablet Take 1,000 mcg by mouth daily.   Yes [provider]  ? ? ?Vital Signs: ?BP (!) 128/46 (BP Location: Right Arm)   Pulse 73   Temp (!) 97.5 ?F (36.4 ?C)   Resp 17   Ht '5\' 5"'$  (1.651 m)   Wt 126 lb 1.7 oz (57.2 kg)   SpO2 97%   BMI 20.98 kg/m?  ? ?Physical Exam ? ?General: Alert, NAD, sitting upright in bed ?Chest: Right sided posterior chest tube dressing C/D/I with serous appearing fluid in pleur-evac 60 cc, no air leak  ? ?Imaging: ?DG Chest 2 View ? ?Result Date: 04/15/2021 ?CLINICAL DATA:  Parapneumonic effusion. EXAM: CHEST - 2 VIEW COMPARISON:  04/12/2021 and CT chest 03/05/2011. FINDINGS: Trachea is midline. Heart is enlarged. Thoracic aorta is calcified. Moderate to large loculated right pleural  effusion with air-fluid levels. Collapse/consolidation in the right lung base. Biapical pleural thickening. Trace left pleural effusion. IMPRESSION: 1. Moderate to large loculated right pleural effusion with air-fluid levels. Amount of air is greater than expected for recent thoracentesis. Empyema can have this appearance. Bronchopleural or alveolar pleural fistula cannot be excluded. These results will be called to the ordering clinician or representative by the Radiologist Assistant, and communication documented in the PACS or [employer]. 2. Right basilar collapse/consolidation, possibly due to pneumonia. Followup PA and lateral chest X-ray is recommended in 3-4 weeks following trial of antibiotic therapy to ensure resolution and exclude underlying malignancy. 3. Trace left pleural effusion. Electronically Signed   By: Lorin Picket M.D.   On: 04/15/2021 09:51  ? ?CT CHEST W CONTRAST ? ?Result Date: 04/15/2021 ?CLINICAL DATA:  Empyema. EXAM: CT CHEST WITH CONTRAST TECHNIQUE: Multidetector CT imaging of the chest was performed during intravenous contrast administration. RADIATION DOSE REDUCTION: This exam was performed according to the departmental dose-optimization program which includes automated exposure control, adjustment of the mA and/or kV according to patient size and/or use of iterative reconstruction technique. CONTRAST:  43m OMNIPAQUE IOHEXOL 300 MG/ML  SOLN COMPARISON:  03/05/2011.  Abdomen pelvis CT 04/11/2021 FINDINGS: Cardiovascular: The heart size is normal. No substantial pericardial effusion. Mild atherosclerotic calcification is noted in the wall of the thoracic aorta. Mediastinum/Nodes: Mild mediastinal lymphadenopathy evident. 10 mm short axis AP window lymph node identified on 53/2. 14 mm short axis subcarinal lymph node evident. Mild lymphadenopathy noted right hilum. The esophagus has  normal imaging features. There is no axillary lymphadenopathy. Lungs/Pleura: Loculated collection  gas and fluid identified posterior right pleural space with loculated component subpleural fluid seen laterally and anteriorly at the right base. Basilar atelectasis noted in the right middle and lower lobes. No suspicious nodule or mass on the left. No left pleural effusion. Upper Abdomen: Unremarkable. Musculoskeletal: No worrisome lytic or sclerotic osseous abnormality. IMPRESSION: 1. Loculated gas and fluid collection in the posterior right pleural space with loculated component seen laterally and anteriorly at the right base. Imaging features compatible with reported history of empyema. 2. Mild mediastinal and right hilar lymphadenopathy, likely reactive. 3. Aortic Atherosclerosis (ICD10-I70.0). Electronically Signed   By: Misty Stanley M.D.   On: 04/15/2021 12:53  ? ?DG Chest Port 1 View ? ?Result Date: 04/17/2021 ?CLINICAL DATA:  Status post chest tube placement. EXAM: PORTABLE CHEST 1 VIEW COMPARISON:  March 13, 23. FINDINGS: Status post chest tube placement with pigtail projecting the at the medial and lower right lung. Resolution of previously seen air-fluid level with hazy opacities in the right mid basilar lung. Small right pleural effusion. No visible pneumothorax. Similar cardiomediastinal silhouette. IMPRESSION: 1. Status post chest tube placement with pigtail projecting the at the medial and lower right lung for treatment of empyema characterized on recent CT chest. 2. Resolution of previously seen air-fluid level with hazy opacities in the right mid and basilar lung and small right pleural effusion. Electronically Signed   By: Margaretha Sheffield M.D.   On: 04/17/2021 08:13  ? ?CT PERC PLEURAL DRAIN W/INDWELL CATH W/IMG GUIDE ? ?Result Date: 04/16/2021 ?INDICATION: Concern for right-sided empyema. Please perform CT-guided chest tube placement for infection source control purposes. EXAM: CT-GUIDED RIGHT-SIDED CHEST TUBE PLACEMENT COMPARISON:  Chest CT-04/15/2021 MEDICATIONS: The patient is currently  admitted to the hospital and receiving intravenous antibiotics. The antibiotics were administered within an appropriate time frame prior to the initiation of the procedure. ANESTHESIA/SEDATION: Moderate (conscious) sedation was employed during this procedure as administered by the Interventional Radiology RN. A total of Versed 1 mg and Fentanyl 50 mcg was administered intravenously. Moderate Sedation Time: 10 minutes. The patient's level of consciousness and vital signs were monitored continuously by radiology nursing throughout the procedure under my direct supervision. CONTRAST:  None COMPLICATIONS: None immediate. PROCEDURE: RADIATION DOSE REDUCTION: This exam was performed according to the departmental dose-optimization program which includes automated exposure control, adjustment of the mA and/or kV according to patient size and/or use of iterative reconstruction technique. Informed written consent was obtained from the patient after a discussion of the risks, benefits and alternatives to treatment. The patient was placed supine, slightly LPO on the CT gantry and a pre procedural CT was performed re-demonstrating the known loculated right-sided hydropneumothorax/empyema. The procedure was planned utilizing a right inferolateral approach. A timeout was performed prior to the initiation of the procedure. The skin overlying the inferior lateral aspect of the right chest was prepped and draped in the usual sterile fashion. The overlying soft tissues were anesthetized with 1% lidocaine with epinephrine. Appropriate trajectory was planned with the use of a 22 gauge spinal needle. An 18 gauge trocar needle was advanced into the right pleural space and a short Amplatz super stiff wire was coiled within the collection. Appropriate positioning was confirmed with a limited CT scan. The tract was serially dilated allowing placement of a 14 French all-purpose drainage catheter. Appropriate positioning was confirmed with a  limited postprocedural CT scan. Next, approximately 80 cc of slightly dark colored serous fluid  was aspirated. The tube was connected to a pleura vac device and sutured in place. A dressing was applied. The patient tol

## 2021-04-17 NOTE — Progress Notes (Signed)
? ? ? ?PULMONOLOGY ? ? ? ? ? ? ? ? ?Date: 04/17/2021,   ?MRN# [id number] PRINCESSA LESMEISTER Jan 17, 1941 ? ? ?  ?AdmissionWeight: 57.2 kg                 ?CurrentWeight: 57.2 kg ? ? ?Referring physician: Dr Roosevelt Locks ? ? ?CHIEF COMPLAINT:  ? ?Secondary bacterial pneumonia with bilateral effusions ? ? ?HISTORY OF PRESENT ILLNESS  ? ?81 year old female with a history of GERD and hypothyroidism who came from [facility] for dyspnea on exertion and shortness of breath at rest right-sided chest discomfort.  Presented to the ED with tachycardia mild hypotension and mild anemia.  Head CT imaging performed with pleural effusion noted.  In the ED developed atrial fibrillation with rapid ventricular response and received IV therapy including metoprolol and amiodarone with subsequent improvement.  Patient was seen by cardiology with additional medications delivered for arrhythmia.  Interventional radiology consultation placed for thoracentesis with pleural fluid studies ordered.  Empirically treated for pneumonia with vancomycin and cefepime as well as Flagyl received resuscitative fluids with LR.  Oncology consultation for severe leucocytosis.  ? ?Patient shares since March 5th on Sunday she developed sore throat and cough and then she got myalgia and malaise. She is has never smoked. She had sister with breast cancer who passed away and anther sister with breast cancer who survived. She reports severe fatigue for at least 5 years progressively worsening.  ? ? ?04/14/21- patient is substantially improved and bloodwork is improved in paralell. Granddaughter [name] was present during interview and physical examination reports feeling well. She is on room air.  ?04/15/21-patient is stable, I met with Son Niel(Gary) Noblett to review serial chest imaging. She is agreeable to chest tube, she is NPO post midnight, shes of eliquis.   Gram + cocci on resp culture ? ?04/16/21- patient did well with no overnight events. Resting in chair smiling during  interview and physical examination. ? ?04/17/21- patient is doing well clinically, reviewed chest tube with serosang draining actively.  Spoke with IR we may need additional pigtail placed. Patietn will move body position to allow drainage and hopefully in supine position fluid will be aspirated better. ASO titers elevated consistent with streptoccocal exposure and when coupled with GPC + resp cultures suggest parapneumonic effusion.  WBC count steadily reducing towards normal. ? ? ?PAST MEDICAL HISTORY  ? ?Past Medical History:  ?Diagnosis Date  ? Cancer [facility])   ? skin  ? ? ? ?SURGICAL HISTORY  ? ?Past Surgical History:  ?Procedure Laterality Date  ? BREAST BIOPSY Bilateral   ? neg  ? ? ? ?FAMILY HISTORY  ? ?Family History  ?Problem Relation Age of Onset  ? Breast cancer Sister 60  ? ? ? ?SOCIAL HISTORY  ? ?Social History  ? ?Tobacco Use  ? Smoking status: Never  ? Smokeless tobacco: Never  ?Substance Use Topics  ? Alcohol use: Not Currently  ? Drug use: Never  ? ? ? ?MEDICATIONS  ? ? ?Home Medication:  ?  ?Current Medication: ? ?Current Facility-Administered Medications:  ?  0.9 %  sodium chloride infusion, , Intravenous, PRN, Sharen Hones, MD, Stopped at 04/15/21 0450 ?  acetaminophen (TYLENOL) tablet 650 mg, 650 mg, Oral, Q6H PRN **OR** acetaminophen (TYLENOL) suppository 650 mg, 650 mg, Rectal, Q6H PRN, Cox, Amy N, DO ?  Ampicillin-Sulbactam (UNASYN) 3 g in sodium chloride 0.9 % 100 mL IVPB, 3 g, Intravenous, Q6H, Sharen Hones, MD, Last Rate: 200 mL/hr at 04/17/21 0518, 3  g at 04/17/21 0518 ?  apixaban (ELIQUIS) tablet 2.5 mg, 2.5 mg, Oral, BID, Wynelle Cleveland, RPH ?  diltiazem (CARDIZEM SR) 12 hr capsule 120 mg, 120 mg, Oral, Q12H, Sharen Hones, MD, 120 mg at 04/16/21 2226 ?  fentaNYL (SUBLIMAZE) injection, , Intravenous, PRN, Sandi Mariscal, MD, Given by Other at 04/16/21 1419 ?  iron polysaccharides (NIFEREX) capsule 150 mg, 150 mg, Oral, Daily, Sharen Hones, MD, 150 mg at 04/16/21 2440 ?  latanoprost  (XALATAN) 0.005 % ophthalmic solution 1 drop, 1 drop, Both Eyes, QHS, Cox, Amy N, DO, 1 drop at 04/16/21 2302 ?  levothyroxine (SYNTHROID) tablet 75 mcg, 75 mcg, Oral, Q0600, Cox, Amy N, DO, 75 mcg at 04/17/21 0515 ?  loperamide (IMODIUM) capsule 2 mg, 2 mg, Oral, PRN, Sharen Hones, MD ?  melatonin tablet 5 mg, 5 mg, Oral, QHS PRN, Foust, Katy L, NP, 5 mg at 04/16/21 2226 ?  metoprolol tartrate (LOPRESSOR) injection 5 mg, 5 mg, Intravenous, Q6H PRN, Sharen Hones, MD, 5 mg at 04/13/21 0941 ?  midazolam (VERSED) injection, , Intravenous, PRN, Sandi Mariscal, MD, Given by Other at 04/16/21 1420 ?  multivitamin with minerals tablet 1 tablet, 1 tablet, Oral, Daily, Cox, Amy N, DO, 1 tablet at 04/16/21 0907 ?  ondansetron (ZOFRAN) tablet 4 mg, 4 mg, Oral, Q6H PRN **OR** ondansetron (ZOFRAN) injection 4 mg, 4 mg, Intravenous, Q6H PRN, Cox, Amy N, DO ?  oxyCODONE-acetaminophen (PERCOCET/ROXICET) 5-325 MG per tablet 0.5 tablet, 0.5 tablet, Oral, Q4H PRN, Sharen Hones, MD ?  pantoprazole (PROTONIX) EC tablet 40 mg, 40 mg, Oral, Daily, Cox, Amy N, DO, 40 mg at 04/16/21 0907 ?  sodium chloride flush (NS) 0.9 % injection 3 mL, 3 mL, Intravenous, Q12H, Sharen Hones, MD, 3 mL at 04/16/21 0910 ?  vitamin B-12 (CYANOCOBALAMIN) tablet 1,000 mcg, 1,000 mcg, Oral, Daily, Cox, Amy N, DO, 1,000 mcg at 04/16/21 0907 ? ? ? ?ALLERGIES  ? ?Patient has no known allergies. ? ? ? ? ?REVIEW OF SYSTEMS  ? ? ?Review of Systems: ? ?Gen:  Denies  fever, sweats, chills weigh loss  ?HEENT: Denies blurred vision, double vision, ear pain, eye pain, hearing loss, nose bleeds, sore throat ?Cardiac:  No dizziness, chest pain or heaviness, chest tightness,edema ?Resp:   Denies cough or sputum porduction, shortness of breath,wheezing, hemoptysis,  ?Gi: Denies swallowing difficulty, stomach pain, nausea or vomiting, diarrhea, constipation, bowel incontinence ?Gu:  Denies bladder incontinence, burning urine ?Ext:   Denies Joint pain, stiffness or swelling ?Skin:  Denies  skin rash, easy bruising or bleeding or hives ?Endoc:  Denies polyuria, polydipsia , polyphagia or weight change ?Psych:   Denies depression, insomnia or hallucinations  ? ?Other:  All other systems negative ? ? ?VS: BP (!) 132/54 (BP Location: Right Arm)   Pulse 68   Temp 97.7 ?F (36.5 ?C)   Resp 19   Ht _0  (1.651 m)   Wt 57.2 kg   SpO2 94%   BMI 20.98 kg/m?   ? ? ? ?PHYSICAL EXAM  ? ? ?GENERAL:NAD, no fevers, chills, no weakness no fatigue ?HEAD: Normocephalic, atraumatic.  ?EYES: Pupils equal, round, reactive to light. Extraocular muscles intact. No scleral icterus.  ?MOUTH: Moist mucosal membrane. Dentition intact. No abscess noted.  ?EAR, NOSE, THROAT: Clear without exudates. No external lesions.  ?NECK: Supple. No thyromegaly. No nodules. No JVD.  ?PULMONARY: mild rhonchi worse at R base ?CARDIOVASCULAR: S1 and S2. Regular rate and rhythm. No murmurs, rubs, or gallops. No edema.  Pedal pulses 2+ bilaterally.  ?GASTROINTESTINAL: Soft, nontender, nondistended. No masses. Positive bowel sounds. No hepatosplenomegaly.  ?MUSCULOSKELETAL: No swelling, clubbing, or edema. Range of motion full in all extremities.  ?NEUROLOGIC: Cranial nerves II through XII are intact. No gross focal neurological deficits. Sensation intact. Reflexes intact.  ?SKIN: No ulceration, lesions, rashes, or cyanosis. Skin warm and dry. Turgor intact.  ?PSYCHIATRIC: Mood, affect within normal limits. The patient is awake, alert and oriented x 3. Insight, judgment intact.  ? ? ?  ? ?IMAGING  ? ? ?DG Chest 2 View ? ?Result Date: 04/15/2021 ?CLINICAL DATA:  Parapneumonic effusion. EXAM: CHEST - 2 VIEW COMPARISON:  04/12/2021 and CT chest 03/05/2011. FINDINGS: Trachea is midline. Heart is enlarged. Thoracic aorta is calcified. Moderate to large loculated right pleural effusion with air-fluid levels. Collapse/consolidation in the right lung base. Biapical pleural thickening. Trace left pleural effusion. IMPRESSION: 1. Moderate to  large loculated right pleural effusion with air-fluid levels. Amount of air is greater than expected for recent thoracentesis. Empyema can have this appearance. Bronchopleural or alveolar pleural fistula cannot be excluded.

## 2021-04-17 NOTE — TOC Benefit Eligibility Note (Signed)
Patient Advocate Encounter ? ?Insurance verification completed.   ? ?The patient is currently admitted and upon discharge could be taking Eliquis 2.5 mg. ? ?The current 30 day co-pay is, $45.00.  ? ?The patient is insured through [employer] Medicare Part D  ? ? ? ?Lyndel Safe, CPhT ?Pharmacy Patient Advocate Specialist ?[facility] Patient Advocate Team ?Direct Number: [phone]  Fax: [phone] ? ? ? ? ? ?  ?

## 2021-04-17 NOTE — Progress Notes (Signed)
Mobility Specialist - Progress Note ? ? 04/17/21 1509  ?Mobility  ?Activity Ambulated with assistance in hallway;Stood at bedside;Dangled on edge of bed  ?Level of Assistance Standby assist, set-up cues, supervision of patient - no hands on  ?Assistive Device Front wheel walker  ?Distance Ambulated (ft) 360 ft  ?Activity Response Tolerated well  ?$Mobility charge 1 Mobility  ? ? ?Post-mobility: 93 HR,93% SPO2 ? ?Pt supine upon arrival using RA. Pt completes bed mobility and STS indep. Ambulates 360 ft SBA ---voices no complaints. Pt left in bed with needs in reach. ? ?Katherine Bradshaw ?Mobility Specialist ?04/17/21, 3:12 PM ? ? ?

## 2021-04-17 NOTE — Progress Notes (Signed)
Physical Therapy Treatment ?Patient Details ?Name: Katherine Bradshaw ?MRN: [id number] ?DOB: 1940-03-11 ?Today's Date: 04/17/2021 ? ? ?History of Present Illness Patient is an 81 year old female with a PMH (+) for  hypothyroidism, and GERD. Patient presented to ED for SOB, and was admitted for new onset of atrial fibrillation, bacterial pneumonia c/b loculated pleural effusion, and acute CHF. ? ?  ?PT Comments  ? ? Pt was long sitting in bed upon arriving. MD cleared pt to be removed from chest tube suction for routine ambulation. She easily was able to exit bed, stand, and ambulate without LOB or safety concern. She tolerated 2 laps (2x 260f) with RW and without AD. Overall pt is doing extremely well form a PT standpoint. She does not need PT after acute admission. Will continue to follow per current POC progressing as able per pt tolerance.   ?Recommendations for follow up therapy are one component of a multi-disciplinary discharge planning process, led by the attending physician.  Recommendations may be updated based on patient status, additional functional criteria and insurance authorization. ? ?Follow Up Recommendations ? No PT follow up ?  ?  ?Assistance Recommended at Discharge PRN  ?Patient can return home with the following Help with stairs or ramp for entrance ?  ?Equipment Recommendations ? None recommended by PT  ?  ?   ?Precautions / Restrictions Precautions ?Precautions: Fall ?Restrictions ?Weight Bearing Restrictions: No  ?  ? ?Mobility ? Bed Mobility ?Overal bed mobility: Independent ? General bed mobility comments: flat bed without any physical assistance or Vcs ?  ? ?Transfers ?Overall transfer level: Independent ?Equipment used: Rolling walker (2 wheels), None ? General transfer comment: pt performed STS with RW and without AD. no physical assistance required ?  ? ?Ambulation/Gait ?Ambulation/Gait assistance: Supervision ?Gait Distance (Feet): 400 Feet ?Assistive device: Rolling walker (2 wheels),  None ?Gait Pattern/deviations: WFL(Within Functional Limits) ?Gait velocity: WNL ?  ?  ?General Gait Details: pt ambulated 200 ft with RW and 200 ft without AD. has quad cane for home use however no LOB observed with ambulation without AD. HR/RR/sao2 all stable throughout ? ? ?Stairs ?Stairs: Yes ?Stairs assistance: Supervision ?Stair Management: One rail Left, Alternating pattern, Forwards ?Number of Stairs: 6 ?  ? ? ?  ?Balance Overall balance assessment: Modified Independent ?  ?  ?  ?Cognition Arousal/Alertness: Awake/alert ?Behavior [facility]: [facility] ?Overall Cognitive Status: Within Functional Limits for tasks assessed ?  ?   ?General Comments: A&O x 4 ?  ?  ? ?  ?   ?   ? ?Pertinent Vitals/Pain Pain Assessment ?Pain Assessment: No/denies pain  ? ? ? ?PT Goals (current goals can now be found in the care plan section) Acute Rehab PT Goals ?Patient Stated Goal: to go home ?Progress towards PT goals: Progressing toward goals ? ?  ?Frequency ? ? ? Min 2X/week ? ? ? ?  ?PT Plan Current plan remains appropriate  ? ? ?   ?AM-PAC PT "6 Clicks" Mobility   ?Outcome Measure ? Help needed turning from your back to your side while in a flat bed without using bedrails?: None ?Help needed moving from lying on your back to sitting on the side of a flat bed without using bedrails?: None ?Help needed moving to and from a bed to a chair (including a wheelchair)?: None ?Help needed standing up from a chair using your arms (e.g., wheelchair or bedside chair)?: None ?Help needed to walk in hospital room?: None ?  Help needed climbing 3-5 steps with a railing? : A Little ?6 Click Score: 23 ? ?  ?End of Session   ?Activity Tolerance: Patient tolerated treatment well ?Patient left: in chair;with call bell/phone within reach;with nursing/sitter in room ?Nurse Communication: Mobility status ?PT Visit Diagnosis: Muscle weakness (generalized) (M62.81);Unsteadiness on feet (R26.81) ?  ? ? ?Time: 3567-0141 ?PT  Time Calculation (min) (ACUTE ONLY): 23 min ? ?Charges:  $Gait Training: 8-22 mins ?$Therapeutic Activity: 8-22 mins          ?          ?Julaine Fusi PTA ?04/17/21, 8:26 AM  ? ?

## 2021-04-17 NOTE — Progress Notes (Signed)
?Progress Note ? ? ?Patient: Katherine Bradshaw MRN:[id number] DOB: 11/12/1940 DOA: 04/11/2021     5 ?DOS: the patient was seen and examined on 04/17/2021 ?  ?Brief hospital course: ?Ms. Juanity Gossett is a 81 year old female with medical history of hypothyroid, GERD, who presents to the emergency department from [facility] for chief concerns of shortness of breath, right lower quadrant pain, weakness. ?Patient had shingles about a month ago, had upper respiratory infection for the last week.  Started have a cough and shortness of breath.  Also had right lower quadrant abdominal pain, which he has resolved after 2 episode of diarrhea at early morning of 3/10. ?Upon arriving the hospital, she had severe leukocytosis with white cell count 72.2, reactive thrombocytosis, lactic acidosis of 2.7.  Sodium 123.  Her vital signs showed atrial fibrillation with RVR 126, tachypnea.  She was placed on broad-spectrum antibiotics for severe sepsis.  Her CT scan of abdomen/pelvis did not show any acute changes.  Lower chest had a small loculated pleural effusion.  However on chest x-ray, there is very small amount of pleural effusion. ?Thoracentesis was performed on 3/10, removed 50 mL of fluids.  LDH 3085, neutrophil 90% with total white cell 5842. ? ?3/12.  Condition improving, antibiotic changed to Unasyn. ?3/13.  Repeated chest x-ray showed reaccumulation of right-sided pleural effusion, CT scan suspect empyema.  Chest tube is placed 3/14.  ? ?Assessment and Plan: ?* Parapneumonic effusion ?Thoracentesis culture negative.  CT scan of the chest concerning for empyema.  Chest tube placed on 04/16/2021.  Continue Unasyn.  Pulmonary following.  Case discussed with interventional radiology and pulmonary today.  Trying suction on the tube to try to remove more fluid. ? ? ?Severe sepsis (HCC) ?Patient met severe sepsis criteria with severe leukocytosis, tachypnea and tachycardia.  Patient also has elevated lactic acid.  Blood cultures  are negative.  Condition has improved. ? ?Leukemoid reaction ?Patient initial white cell count was 86,000, patient was initially evaluated by hematology, flow cytometry did not show any evidence of malignancy.  This appears to be secondary to severe sepsis.  Condition has improved.  Last white count 13.5 ? ?New onset atrial fibrillation (HCC) ?Patient on Eliquis for anticoagulation.  Diltiazem for rate control. ? ? ?Reactive thrombocytosis ?Platelet count up at 611 today. ? ?Hyponatremia ?Sodium 131.  Continue to monitor ? ?Acute on chronic diastolic CHF (congestive heart failure) (HCC) ?Echocardiogram showed normal ejection fraction, elevated BNP probably from volume.  She received IV Lasix.  Holding off on further Lasix at this point. ? ?Hypothyroidism ?Continue Synthroid. ? ?Iron deficiency anemia ?Continue oral iron.  Today's hemoglobin 10.4 ? ? ? ? ?  ? ?Subjective: Patient feeling better today.  Had chest tube placed yesterday.  White blood cell count trending better.  Admitted for severe sepsis. ? ?Physical Exam: ?Vitals:  ? 04/17/21 0355 04/17/21 0744 04/17/21 1228 04/17/21 1504  ?BP: (!) 133/53 (!) 132/54 (!) 141/52 (!) 128/46  ?Pulse: 68 68 72 73  ?Resp: 18 19  17  ?Temp: 98.5 ?F (36.9 ?C) 97.7 ?F (36.5 ?C) (!) 97.4 ?F (36.3 ?C) (!) 97.5 ?F (36.4 ?C)  ?TempSrc:      ?SpO2: 95% 94% 97% 97%  ?Weight:      ?Height:      ? ?Physical Exam ?HENT:  ?   Head: Normocephalic.  ?   Mouth/Throat:  ?   Pharynx: No oropharyngeal exudate.  ?Eyes:  ?   General: Lids are normal.  ?     Conjunctiva/sclera: Conjunctivae normal.  ?Cardiovascular:  ?   Rate and Rhythm: Normal rate. Rhythm irregularly irregular.  ?   Heart sounds: Normal heart sounds, S1 normal and S2 normal.  ?Pulmonary:  ?   Breath sounds: Examination of the right-lower field reveals decreased breath sounds. Examination of the left-lower field reveals decreased breath sounds. Decreased breath sounds present. No wheezing, rhonchi or rales.  ?Abdominal:  ?    Palpations: Abdomen is soft.  ?   Tenderness: There is no abdominal tenderness.  ?Musculoskeletal:  ?   Right lower leg: No swelling.  ?   Left lower leg: No swelling.  ?Skin: ?   General: Skin is warm.  ?   Findings: No rash.  ?Neurological:  ?   Mental Status: She is alert and oriented to person, place, and time.  ?  ?Data Reviewed: ?Laboratory and radiological data reviewed during hospital course.  ASO titer high.  Initial white blood cell count high but coming down to 13.5.  LDH in the fluid up at 3185.  Cultures of the pleural fluid negative.  Cytology of the fluid negative. ? ?Family Communication: Updated patient's son on the phone ? ?Disposition: ?Status is: Inpatient ?Remains inpatient appropriate because: The patient has a chest tube and will not go home with a chest tube. ? ?Planned Discharge Destination: Home ? ? ?Author: ?Loletha Grayer, MD ?04/17/2021 4:16 PM ? ?For on call review [email or web address].  ?

## 2021-04-18 DIAGNOSIS — D72823 Leukemoid reaction: Secondary | ICD-10-CM | POA: Diagnosis not present

## 2021-04-18 DIAGNOSIS — A419 Sepsis, unspecified organism: Secondary | ICD-10-CM | POA: Diagnosis not present

## 2021-04-18 DIAGNOSIS — I480 Paroxysmal atrial fibrillation: Secondary | ICD-10-CM | POA: Diagnosis not present

## 2021-04-18 DIAGNOSIS — J189 Pneumonia, unspecified organism: Secondary | ICD-10-CM | POA: Diagnosis not present

## 2021-04-18 LAB — BASIC METABOLIC PANEL
Anion gap: 6 (ref 5–15)
BUN: 7 mg/dL — ABNORMAL LOW (ref 8–23)
CO2: 26 mmol/L (ref 22–32)
Calcium: 8 mg/dL — ABNORMAL LOW (ref 8.9–10.3)
Chloride: 99 mmol/L (ref 98–111)
Creatinine, Ser: 0.57 mg/dL (ref 0.44–1.00)
GFR, Estimated: >60 mL/min (ref >60)
Glucose, Bld: 103 mg/dL — ABNORMAL HIGH (ref 70–99)
Potassium: 3.9 mmol/L (ref 3.5–5.1)
Sodium: 131 mmol/L — ABNORMAL LOW (ref 135–145)

## 2021-04-18 LAB — ASPERGILLUS ANTIGEN, BAL/SERUM: Aspergillus Ag, BAL/Serum: 0.08 Index (ref 0.00–0.49)

## 2021-04-18 LAB — VARICELLA-ZOSTER BY PCR: Varicella-Zoster, PCR: NEGATIVE

## 2021-04-18 MED ORDER — LORATADINE 10 MG PO TABS
10.0000 mg | ORAL_TABLET | Freq: Every day | ORAL | Status: DC
  Administered 2021-04-18 – 2021-04-19 (×2): 10 mg via ORAL
  Filled 2021-04-18 (×2): qty 1

## 2021-04-18 NOTE — Progress Notes (Signed)
Physical Therapy Treatment ?Patient Details ?Name: Katherine Bradshaw ?MRN: [id number] ?DOB: 1941/01/06 ?Today's Date: 04/18/2021 ? ? ?History of Present Illness Patient is an 81 year old female with a PMH (+) for  hypothyroidism, and GERD. Patient presented to ED for SOB, and was admitted for new onset of atrial fibrillation, bacterial pneumonia c/b loculated pleural effusion, and acute CHF. ? ?  ?PT Comments  ? ? Pt was long sitting in bed upon arriving. She is A and O x 4 and extremely pleasant. Easily and safely able to exit bed, stand, and ambulate with cane. She demonstrates good overall gait kinematics and is progressing well towards all PT goals. PT does not feel she will require any follow PT post admission. Will continue to follow while pt is in hospital.  ?  ?Recommendations for follow up therapy are one component of a multi-disciplinary discharge planning process, led by the attending physician.  Recommendations may be updated based on patient status, additional functional criteria and insurance authorization. ? ?Follow Up Recommendations ? No PT follow up ?  ?  ?Assistance Recommended at Discharge PRN  ?Patient can return home with the following Help with stairs or ramp for entrance ?  ?Equipment Recommendations ? None recommended by PT  ?  ?   ?Precautions / Restrictions Precautions ?Precautions: Fall ?Restrictions ?Weight Bearing Restrictions: No  ?  ? ?Mobility ? Bed Mobility ?Overal bed mobility: Independent ?  ?  ?Transfers ?Overall transfer level: Independent ?  ?Ambulation/Gait ?Ambulation/Gait assistance: Supervision ?Gait Distance (Feet): 400 Feet ?Assistive device: Quad cane ?Gait Pattern/deviations: WFL(Within Functional Limits) ?Gait velocity: WNL ?  ?  ?General Gait Details: pt does have some unsteadiness however no physical assistance/ intervention required. Do recommend pt use AD at all times until full strength and balance imporve ? ? ?Stairs ?Stairs: Yes ?Stairs assistance: Supervision ?Stair  Management: One rail Left, Alternating pattern, Forwards ?Number of Stairs: 6 ?  ?  ?Balance Overall balance assessment: Modified Independent ?Sitting-balance support: No upper extremity supported, Feet supported ?Sitting balance-Leahy Scale: Normal ?  ?  ?Standing balance support: Single extremity supported, During functional activity, Reliant on assistive device for balance ?Standing balance-Leahy Scale: Fair ?  ?  ?  ?Cognition Arousal/Alertness: Awake/alert ?Behavior During Therapy: [facility] for tasks assessed/performed ?Overall Cognitive Status: Within Functional Limits for tasks assessed ?  ?   ?General Comments: A&O x 4 ?  ?  ? ?  ?   ?   ? ?Pertinent Vitals/Pain Pain Assessment ?Pain Assessment: 0-10 ?Pain Score: 4  ?Pain Location: LBP ?Pain Descriptors / Indicators: Discomfort ?Pain Intervention(s): Limited activity within patient's tolerance, Monitored during session, Premedicated before session, Repositioned  ? ? ? ?PT Goals (current goals can now be found in the care plan section) Acute Rehab PT Goals ?Patient Stated Goal: to go home ?Progress towards PT goals: Progressing toward goals ? ?  ?Frequency ? ? ? Min 2X/week ? ? ? ?  ?PT Plan Current plan remains appropriate  ? ? ?   ?AM-PAC PT "6 Clicks" Mobility   ?Outcome Measure ? Help needed turning from your back to your side while in a flat bed without using bedrails?: None ?Help needed moving from lying on your back to sitting on the side of a flat bed without using bedrails?: None ?Help needed moving to and from a bed to a chair (including a wheelchair)?: None ?Help needed standing up from a chair using your arms (e.g., wheelchair or bedside chair)?: None ?Help needed to walk in  hospital room?: A Little ?Help needed climbing 3-5 steps with a railing? : A Little ?6 Click Score: 22 ? ?  ?End of Session Equipment Utilized During Treatment: Gait belt ?Activity Tolerance: Patient tolerated treatment well ?Patient left: in chair;with call bell/phone within  reach;with nursing/sitter in room ?Nurse Communication: Mobility status ?PT Visit Diagnosis: Muscle weakness (generalized) (M62.81);Unsteadiness on feet (R26.81) ?  ? ? ?Time: 5701-7793 ?PT Time Calculation (min) (ACUTE ONLY): 24 min ? ?Charges:  $Gait Training: 23-37 mins          ?          ?Julaine Fusi PTA ?04/18/21, 11:15 AM  ? ?

## 2021-04-18 NOTE — Progress Notes (Signed)
Patient transferred to to room 205. Report was given to receiving nurse   ?

## 2021-04-18 NOTE — Progress Notes (Signed)
?Progress Note ? ? ?Patient: Katherine Bradshaw MEQ:[id number] DOB: [birth year] DOA: 04/11/2021     6 ?DOS: the patient was seen and examined on 04/18/2021 ?  ?Brief hospital course: ?Ms. Noland Fordyce Carmer is a 81 year old female with medical history of hypothyroid, GERD, who presents to the emergency department from [facility] clinic for chief concerns of shortness of breath, right lower quadrant pain, weakness. ?Patient had shingles about a month ago, had upper respiratory infection for the last week.  Started have a cough and shortness of breath.  Also had right lower quadrant abdominal pain, which he has resolved after 2 episode of diarrhea at early morning of 3/10. ?Upon arriving the hospital, she had severe leukocytosis with white cell count 72.2, reactive thrombocytosis, lactic acidosis of 2.7.  Sodium 123.  Her vital signs showed atrial fibrillation with RVR 126, tachypnea.  She was placed on broad-spectrum antibiotics for severe sepsis.  Her CT scan of abdomen/pelvis did not show any acute changes.  Lower chest had a small loculated pleural effusion.  However on chest x-ray, there is very small amount of pleural effusion. ?Thoracentesis was performed on 3/10, removed 50 mL of fluids.  LDH 3085, neutrophil 90% with total white cell 5842. ? ?3/12.  Condition improving, antibiotic changed to Unasyn. ?3/13.  Repeated chest x-ray showed reaccumulation of right-sided pleural effusion, CT scan suspect empyema.  Chest tube is placed 3/14.  ? ?Assessment and Plan: ?* Parapneumonic effusion ?Thoracentesis culture negative.  CT scan of the chest concerning for empyema.  Chest tube placed on 04/16/2021.  Continue Unasyn.  Pulmonary following and interventional radiology following. ? ? ?Severe sepsis ([place]) ?Present on admission.  Patient met severe sepsis criteria with severe leukocytosis, tachypnea and tachycardia.  Patient also has elevated lactic acid.  Blood cultures are negative.  Condition has improved. ? ?Leukemoid  reaction ?Patient initial white cell count was 86,000, patient was initially evaluated by hematology, flow cytometry did not show any evidence of malignancy.  This appears to be secondary to severe sepsis.  Condition has improved.  Last white count 13.5 ? ?New onset atrial fibrillation ([place]) ?Paroxysmal in nature.  Has been in sinus rhythm since the 12th.  Patient on Eliquis for anticoagulation.  Benefits and risks of blood thinner explained to patient.  Diltiazem for rate control. ? ? ?Reactive thrombocytosis ?Platelet count up at 611 as of yesterday ? ?Hyponatremia ?Sodium 131.  Continue to monitor ? ?Acute on chronic diastolic CHF (congestive heart failure) ([place]) ?Echocardiogram showed normal ejection fraction, elevated BNP probably from volume.  She received IV Lasix.  Holding off on further Lasix at this point. ? ?Hypothyroidism ?Continue Synthroid. ? ?Iron deficiency anemia ?Continue oral iron.  Last hemoglobin 10.4 ? ? ? ? ?  ? ?Subjective: Patient feels fine and wondering what the plan is.  Since she has a chest tube and she will remain here while the chest tube is in.  Admitted with severe sepsis and found to have a parapneumonic effusion.  She does have some pressure in her right ear today. ? ?Physical Exam: ?Vitals:  ? 04/18/21 0713 04/18/21 0804 04/18/21 1137 04/18/21 1546  ?BP: 94/64 (!) 142/51 105/74 (!) 129/49  ?Pulse: 67 66 65 72  ?Resp: _0 ?Temp: (!) 97.5 ?F (36.4 ?C) (!) 97.5 ?F (36.4 ?C) 98.1 ?F (36.7 ?C) 97.9 ?F (36.6 ?C)  ?TempSrc: Oral  Oral Oral  ?SpO2: 97% 98% 99% 99%  ?Weight:      ?Height:      ? ?  Physical Exam ?HENT:  ?   Head: Normocephalic.  ?   Mouth/Throat:  ?   Pharynx: No oropharyngeal exudate.  ?Eyes:  ?   General: Lids are normal.  ?   Conjunctiva/sclera: Conjunctivae normal.  ?Cardiovascular:  ?   Rate and Rhythm: Normal rate. Rhythm irregularly irregular.  ?   Heart sounds: Normal heart sounds, S1 normal and S2 normal.  ?Pulmonary:  ?   Breath sounds: Examination of the  right-lower field reveals decreased breath sounds. Examination of the left-lower field reveals decreased breath sounds. Decreased breath sounds present. No wheezing, rhonchi or rales.  ?Abdominal:  ?   Palpations: Abdomen is soft.  ?   Tenderness: There is no abdominal tenderness.  ?Musculoskeletal:  ?   Right lower leg: No swelling.  ?   Left lower leg: No swelling.  ?Skin: ?   General: Skin is warm.  ?   Findings: No rash.  ?Neurological:  ?   Mental Status: She is alert and oriented to person, place, and time.  ?  ?Data Reviewed: ? ?Sodium 131 ? ?Family Communication: Updated son yesterday ? ?Disposition: ?Status is: Inpatient ?Remains inpatient appropriate because: Patient has a chest tube and cannot go home with a chest tube. ? ?Planned Discharge Destination: Home ? ? ?Author: ?Loletha Grayer, MD ?04/18/2021 5:05 PM ? ?For on call review [email or web address].  ?

## 2021-04-18 NOTE — Progress Notes (Signed)
? ? ? ?PULMONOLOGY ? ? ? ? ? ? ? ? ?Date: 04/18/2021,   ?MRN# [id number] Katherine Bradshaw 12/24/1940 ? ? ?  ?AdmissionWeight: 57.2 kg                 ?CurrentWeight: 57.2 kg ? ? ?Referring physician: Dr Roosevelt Locks ? ? ?CHIEF COMPLAINT:  ? ?Secondary bacterial pneumonia with bilateral effusions ? ? ?HISTORY OF PRESENT ILLNESS  ? ?81 year old female with a history of GERD and hypothyroidism who came from [facility] for dyspnea on exertion and shortness of breath at rest right-sided chest discomfort.  Presented to the ED with tachycardia mild hypotension and mild anemia.  Head CT imaging performed with pleural effusion noted.  In the ED developed atrial fibrillation with rapid ventricular response and received IV therapy including metoprolol and amiodarone with subsequent improvement.  Patient was seen by cardiology with additional medications delivered for arrhythmia.  Interventional radiology consultation placed for thoracentesis with pleural fluid studies ordered.  Empirically treated for pneumonia with vancomycin and cefepime as well as Flagyl received resuscitative fluids with LR.  Oncology consultation for severe leucocytosis.  ? ?Patient shares since March 5th on Sunday she developed sore throat and cough and then she got myalgia and malaise. She is has never smoked. She had sister with breast cancer who passed away and anther sister with breast cancer who survived. She reports severe fatigue for at least 5 years progressively worsening.  ? ? ?04/14/21- patient is substantially improved and bloodwork is improved in paralell. Granddaughter [name] was present during interview and physical examination reports feeling well. She is on room air.  ?04/15/21-patient is stable, I met with Son Niel(Gary) Millikin to review serial chest imaging. She is agreeable to chest tube, she is NPO post midnight, shes of eliquis.   Gram + cocci on resp culture ? ?04/16/21- patient did well with no overnight events. Resting in chair smiling during  interview and physical examination. ? ?04/17/21- patient is doing well clinically, reviewed chest tube with serosang draining actively.  Spoke with IR we may need additional pigtail placed. Patietn will move body position to allow drainage and hopefully in supine position fluid will be aspirated better. ASO titers elevated consistent with streptoccocal exposure and when coupled with GPC + resp cultures suggest parapneumonic effusion.  WBC count steadily reducing towards normal. ? ? ?04/18/21- Patient further improved, please connect to wall suction -20 when not doing PT. Repeat CXR in am.  ? ?PAST MEDICAL HISTORY  ? ?Past Medical History:  ?Diagnosis Date  ? Cancer [facility])   ? skin  ? ? ? ?SURGICAL HISTORY  ? ?Past Surgical History:  ?Procedure Laterality Date  ? BREAST BIOPSY Bilateral   ? neg  ? ? ? ?FAMILY HISTORY  ? ?Family History  ?Problem Relation Age of Onset  ? Breast cancer Sister 36  ? ? ? ?SOCIAL HISTORY  ? ?Social History  ? ?Tobacco Use  ? Smoking status: Never  ? Smokeless tobacco: Never  ?Substance Use Topics  ? Alcohol use: Not Currently  ? Drug use: Never  ? ? ? ?MEDICATIONS  ? ? ?Home Medication:  ?  ?Current Medication: ? ?Current Facility-Administered Medications:  ?  0.9 %  sodium chloride infusion, , Intravenous, PRN, Sharen Hones, MD, Stopped at 04/15/21 0450 ?  acetaminophen (TYLENOL) tablet 650 mg, 650 mg, Oral, Q6H PRN, 650 mg at 04/18/21 0925 **OR** acetaminophen (TYLENOL) suppository 650 mg, 650 mg, Rectal, Q6H PRN, Cox, Amy N, DO ?  Ampicillin-Sulbactam (UNASYN)  3 g in sodium chloride 0.9 % 100 mL IVPB, 3 g, Intravenous, Q6H, Sharen Hones, MD, Last Rate: 200 mL/hr at 04/18/21 0931, 3 g at 04/18/21 0931 ?  apixaban (ELIQUIS) tablet 2.5 mg, 2.5 mg, Oral, BID, Wynelle Cleveland, RPH, 2.5 mg at 04/18/21 0920 ?  diltiazem (CARDIZEM SR) 12 hr capsule 120 mg, 120 mg, Oral, Q12H, Sharen Hones, MD, 120 mg at 04/18/21 9163 ?  fentaNYL (SUBLIMAZE) injection, , Intravenous, PRN, Sandi Mariscal, MD, Given  by Other at 04/16/21 1419 ?  iron polysaccharides (NIFEREX) capsule 150 mg, 150 mg, Oral, Daily, Sharen Hones, MD, 150 mg at 04/18/21 8466 ?  latanoprost (XALATAN) 0.005 % ophthalmic solution 1 drop, 1 drop, Both Eyes, QHS, Cox, Amy N, DO, 1 drop at 04/17/21 2147 ?  levothyroxine (SYNTHROID) tablet 75 mcg, 75 mcg, Oral, Q0600, Cox, Amy N, DO, 75 mcg at 04/18/21 0537 ?  loperamide (IMODIUM) capsule 2 mg, 2 mg, Oral, PRN, Sharen Hones, MD ?  loratadine (CLARITIN) tablet 10 mg, 10 mg, Oral, Daily, Wieting, Richard, MD, 10 mg at 04/18/21 1147 ?  melatonin tablet 5 mg, 5 mg, Oral, QHS PRN, Foust, Katy L, NP, 5 mg at 04/17/21 2258 ?  metoprolol tartrate (LOPRESSOR) injection 5 mg, 5 mg, Intravenous, Q6H PRN, Sharen Hones, MD, 5 mg at 04/13/21 0941 ?  midazolam (VERSED) injection, , Intravenous, PRN, Sandi Mariscal, MD, Given by Other at 04/16/21 1420 ?  multivitamin with minerals tablet 1 tablet, 1 tablet, Oral, Daily, Cox, Amy N, DO, 1 tablet at 04/18/21 0919 ?  ondansetron (ZOFRAN) tablet 4 mg, 4 mg, Oral, Q6H PRN **OR** ondansetron (ZOFRAN) injection 4 mg, 4 mg, Intravenous, Q6H PRN, Cox, Amy N, DO ?  oxyCODONE-acetaminophen (PERCOCET/ROXICET) 5-325 MG per tablet 0.5 tablet, 0.5 tablet, Oral, Q4H PRN, Sharen Hones, MD, 0.5 tablet at 04/18/21 1146 ?  pantoprazole (PROTONIX) EC tablet 40 mg, 40 mg, Oral, Daily, Cox, Amy N, DO, 40 mg at 04/18/21 0920 ?  sodium chloride flush (NS) 0.9 % injection 3 mL, 3 mL, Intravenous, Q12H, Sharen Hones, MD, 3 mL at 04/17/21 2145 ?  vitamin B-12 (CYANOCOBALAMIN) tablet 1,000 mcg, 1,000 mcg, Oral, Daily, Cox, Amy N, DO, 1,000 mcg at 04/18/21 0919 ? ? ? ?ALLERGIES  ? ?Patient has no known allergies. ? ? ? ? ?REVIEW OF SYSTEMS  ? ? ?Review of Systems: ? ?Gen:  Denies  fever, sweats, chills weigh loss  ?HEENT: Denies blurred vision, double vision, ear pain, eye pain, hearing loss, nose bleeds, sore throat ?Cardiac:  No dizziness, chest pain or heaviness, chest tightness,edema ?Resp:   Denies  cough or sputum porduction, shortness of breath,wheezing, hemoptysis,  ?Gi: Denies swallowing difficulty, stomach pain, nausea or vomiting, diarrhea, constipation, bowel incontinence ?Gu:  Denies bladder incontinence, burning urine ?Ext:   Denies Joint pain, stiffness or swelling ?Skin: Denies  skin rash, easy bruising or bleeding or hives ?Endoc:  Denies polyuria, polydipsia , polyphagia or weight change ?Psych:   Denies depression, insomnia or hallucinations  ? ?Other:  All other systems negative ? ? ?VS: BP 105/74 (BP Location: Right Arm)   Pulse 65   Temp 98.1 ?F (36.7 ?C) (Oral)   Resp 16   Ht _0  (1.651 m)   Wt 57.2 kg   SpO2 99%   BMI 20.98 kg/m?   ? ? ? ?PHYSICAL EXAM  ? ? ?GENERAL:NAD, no fevers, chills, no weakness no fatigue ?HEAD: Normocephalic, atraumatic.  ?EYES: Pupils equal, round, reactive to light. Extraocular muscles intact. No  scleral icterus.  ?MOUTH: Moist mucosal membrane. Dentition intact. No abscess noted.  ?EAR, NOSE, THROAT: Clear without exudates. No external lesions.  ?NECK: Supple. No thyromegaly. No nodules. No JVD.  ?PULMONARY: mild rhonchi worse at R base ?CARDIOVASCULAR: S1 and S2. Regular rate and rhythm. No murmurs, rubs, or gallops. No edema. Pedal pulses 2+ bilaterally.  ?GASTROINTESTINAL: Soft, nontender, nondistended. No masses. Positive bowel sounds. No hepatosplenomegaly.  ?MUSCULOSKELETAL: No swelling, clubbing, or edema. Range of motion full in all extremities.  ?NEUROLOGIC: Cranial nerves II through XII are intact. No gross focal neurological deficits. Sensation intact. Reflexes intact.  ?SKIN: No ulceration, lesions, rashes, or cyanosis. Skin warm and dry. Turgor intact.  ?PSYCHIATRIC: Mood, affect within normal limits. The patient is awake, alert and oriented x 3. Insight, judgment intact.  ? ? ?  ? ?IMAGING  ? ? ?DG Chest 2 View ? ?Result Date: 04/15/2021 ?CLINICAL DATA:  Parapneumonic effusion. EXAM: CHEST - 2 VIEW COMPARISON:  04/12/2021 and CT chest  03/05/2011. FINDINGS: Trachea is midline. Heart is enlarged. Thoracic aorta is calcified. Moderate to large loculated right pleural effusion with air-fluid levels. Collapse/consolidation in the right lung base. Biapical pl

## 2021-04-19 ENCOUNTER — Inpatient Hospital Stay: Payer: PPO

## 2021-04-19 LAB — BASIC METABOLIC PANEL
Anion gap: 9 (ref 5–15)
BUN: 9 mg/dL (ref 8–23)
CO2: 26 mmol/L (ref 22–32)
Calcium: 8.1 mg/dL — ABNORMAL LOW (ref 8.9–10.3)
Chloride: 97 mmol/L — ABNORMAL LOW (ref 98–111)
Creatinine, Ser: 0.56 mg/dL (ref 0.44–1.00)
GFR, Estimated: >60 mL/min (ref >60)
Glucose, Bld: 98 mg/dL (ref 70–99)
Potassium: 3.7 mmol/L (ref 3.5–5.1)
Sodium: 132 mmol/L — ABNORMAL LOW (ref 135–145)

## 2021-04-19 LAB — CBC
HCT: 31.6 % — ABNORMAL LOW (ref 36.0–46.0)
Hemoglobin: 10.3 g/dL — ABNORMAL LOW (ref 12.0–15.0)
MCH: 28.1 pg (ref 26.0–34.0)
MCHC: 32.6 g/dL (ref 30.0–36.0)
MCV: 86.3 fL (ref 80.0–100.0)
Platelets: 671 10*3/uL — ABNORMAL HIGH (ref 150–400)
RBC: 3.66 MIL/uL — ABNORMAL LOW (ref 3.87–5.11)
RDW: 13.9 % (ref 11.5–15.5)
WBC: 14.1 10*3/uL — ABNORMAL HIGH (ref 4.0–10.5)
nRBC: 0 % (ref 0.0–0.2)

## 2021-04-19 MED ORDER — AMOXICILLIN-POT CLAVULANATE 875-125 MG PO TABS: 1.0000 | ORAL_TABLET | Freq: Two times a day (BID) | ORAL | 0 refills | Status: AC

## 2021-04-19 MED ORDER — DILTIAZEM HCL ER 120 MG PO CP12: 120.0000 mg | ORAL_CAPSULE | Freq: Two times a day (BID) | ORAL | 0 refills | Status: AC

## 2021-04-19 MED ORDER — LORATADINE 10 MG PO TABS: 10.0000 mg | ORAL_TABLET | Freq: Every day | ORAL | 0 refills | Status: AC

## 2021-04-19 MED ORDER — POLYSACCHARIDE IRON COMPLEX 150 MG PO CAPS: 150.0000 mg | ORAL_CAPSULE | Freq: Every day | ORAL | 0 refills | Status: AC

## 2021-04-19 MED ORDER — APIXABAN 2.5 MG PO TABS: 2.5000 mg | ORAL_TABLET | Freq: Two times a day (BID) | ORAL | 0 refills | Status: AC

## 2021-04-19 NOTE — Plan of Care (Signed)

## 2021-04-19 NOTE — Progress Notes (Signed)
Physical Therapy Treatment ?Patient Details ?Name: Katherine Bradshaw ?MRN: [id number] ?DOB: 1940/09/22 ?Today's Date: 04/19/2021 ? ? ?History of Present Illness Patient is an 81 year old female with a PMH (+) for  hypothyroidism, and GERD. Patient presented to ED for SOB, and was admitted for new onset of atrial fibrillation, bacterial pneumonia c/b loculated pleural effusion, and acute CHF. ? ?  ?PT Comments  ? ? Pt seen this am, received in bed, agreeable to PT. Right chest tube removed from wall suction, Pt able to exit bed independently. Gait training with personal cane 582f with supervision for safety and verbal cues to slow cadence to reduce loss of balance and conserve energy. Pt tolerated activity well, no SOB, 95% on RA,  HR 102bpm upon exertion. Reviewed seated LE exercises, reconnected chest tube to wall suction, all needs met. Continue PT per POC. ?   ?Recommendations for follow up therapy are one component of a multi-disciplinary discharge planning process, led by the attending physician.  Recommendations may be updated based on patient status, additional functional criteria and insurance authorization. ? ?Follow Up Recommendations ? No PT follow up ?  ?  ?Assistance Recommended at Discharge PRN  ?Patient can return home with the following Help with stairs or ramp for entrance ?  ?Equipment Recommendations ? None recommended by PT  ?  ?Recommendations for Other Services   ? ? ?  ?Precautions / Restrictions Precautions ?Precautions: Fall ?Restrictions ?Weight Bearing Restrictions: No  ?  ? ?Mobility ? Bed Mobility ?Overal bed mobility: Independent ?  ?  ?  ?  ?  ?  ?General bed mobility comments: flat bed without any physical assistance or Vcs ?  ? ?Transfers ?Overall transfer level: Independent ?Equipment used: Straight cane ?  ?  ?  ?  ?  ?  ?  ?  ?  ? ?Ambulation/Gait ?Ambulation/Gait assistance: Supervision ?Gait Distance (Feet): 500 Feet ?Assistive device: Straight cane ?Gait Pattern/deviations:  WFL(Within Functional Limits) ?Gait velocity: WNL ?  ?  ?General Gait Details: pt does have some unsteadiness however no physical assistance/ intervention required. Do recommend pt use AD at all times until full strength and balance imporve ? ? ?Stairs ?  ?  ?  ?  ?  ? ? ?Wheelchair Mobility ?  ? ?Modified Rankin (Stroke Patients Only) ?  ? ? ?  ?Balance   ?  ?  ?  ?  ?  ?  ?  ?  ?  ?  ?  ?  ?  ?  ?  ?  ?  ?  ?  ? ?  ?Cognition Arousal/Alertness: Awake/alert ?Behavior [facility]: [facility] assessed/performed ?Overall Cognitive Status: Within Functional Limits for tasks assessed ?  ?  ?  ?  ?  ?  ?  ?  ?  ?  ?  ?  ?  ?  ?  ?  ?General Comments: A&O x 4 ?  ?  ? ?  ?Exercises   ? ?  ?General Comments General comments (skin integrity, edema, etc.):  (R chest tube intact and removed from suction for PT) ?  ?  ? ?Pertinent Vitals/Pain Pain Assessment ?Pain Assessment: No/denies pain  ? ? ?Home Living   ?  ?  ?  ?  ?  ?  ?  ?  ?  ?   ?  ?Prior Function    ?  ?  ?   ? ?PT Goals (current goals can now be found in  the care plan section) Acute Rehab PT Goals ?Patient Stated Goal: to go home ?Progress towards PT goals: Progressing toward goals ? ?  ?Frequency ? ? ? Min 2X/week ? ? ? ?  ?PT Plan Current plan remains appropriate  ? ? ?Co-evaluation   ?  ?  ?  ?  ? ?  ?AM-PAC PT "6 Clicks" Mobility   ?Outcome Measure ? Help needed turning from your back to your side while in a flat bed without using bedrails?: None ?Help needed moving from lying on your back to sitting on the side of a flat bed without using bedrails?: None ?Help needed moving to and from a bed to a chair (including a wheelchair)?: None ?Help needed standing up from a chair using your arms (e.g., wheelchair or bedside chair)?: None ?Help needed to walk in hospital room?: A Little ?Help needed climbing 3-5 steps with a railing? : A Little ?6 Click Score: 22 ? ?  ?End of Session Equipment Utilized During Treatment: Gait belt ?Activity Tolerance: Patient  tolerated treatment well ?Patient left: in chair;with call bell/phone within reach (Chest tube connected to suction) ?Nurse Communication: Mobility status ?PT Visit Diagnosis: Muscle weakness (generalized) (M62.81);Unsteadiness on feet (R26.81) ?  ? ? ?Time: 2341-4436 ?PT Time Calculation (min) (ACUTE ONLY): 25 min ? ?Charges:  $Gait Training: 8-22 mins ?$Therapeutic Activity: 8-22 mins          ?          ? ?Mikel Cella, PTA ? ? ? ?Katherine Bradshaw ?04/19/2021, 9:53 AM ? ?

## 2021-04-19 NOTE — Progress Notes (Signed)
Mobility Specialist - Progress Note ? ? 04/19/21 1500  ?Mobility  ?Activity Ambulated independently in hallway;Stood at bedside;Dangled on edge of bed  ?Level of Assistance Independent  ?Assistive Device [place]  ?Distance Ambulated (ft) 500 ft  ?Activity Response Tolerated well  ?$Mobility charge 1 Mobility  ? ? ? ?Pre-mobility: 96 HR,  98% SpO2 ?During mobility: 93 HR, 96% SpO2 ? ?Pt supine upon arrival using RA. Pt completes bed mobility STS and ambulates indep ---voices no complaints and denies chest pain, SOB and dizziness. Returns to chair with needs in reach. MD notified of session. ? ?Katherine Bradshaw ?Mobility Specialist ?04/19/21, 3:16 PM ? ? ? ? ?

## 2021-04-19 NOTE — Progress Notes (Signed)
in inpatient. History of  GERD, hypothyroidism. Presented to the ED at [facility] with [facility] and right sided chest pain. Found to be tin a fib with a right sided pleural effusion. IR performed a thoracentesis on 3.10.23 .with 50 ml of pleural fluid removed from the right apex.  Due to concerns for a right sided empyema a chest tube placed on 3.14.23 for infection control.  Request from pulmonology for chest tube removal at this time.  ? ?Informed verbal consent was obtained from the patient after a discussion of the risks, benefits and alternatives to treatment. The 14 Fr right lateral chest tube was prepped.  The external suture was cut and the chest tube was removed successfully without incident. Skin site unremarkable with no erythema, tenderness or drainage noted. Petroleum gauze placed over the  exit site with a clean dressing and Tegaderm applied over the petroleum gauze. Patient tolerated the procedure. ?  ?All questions of the patient's were answered at this time ? ?

## 2021-04-19 NOTE — Progress Notes (Signed)
? ? ? ?PULMONOLOGY ? ? ? ? ? ? ? ? ?Date: 04/19/2021,   ?MRN# [id number] Katherine BROCKSMITH [birth year] ? ? ?  ?AdmissionWeight: 57.2 kg                 ?CurrentWeight: 57.2 kg ? ? ?Referring physician: Dr Roosevelt Locks ? ? ?CHIEF COMPLAINT:  ? ?Secondary bacterial pneumonia with bilateral effusions ? ? ?HISTORY OF PRESENT ILLNESS  ? ?81 year old female with a history of GERD and hypothyroidism who came from [facility] for dyspnea on exertion and shortness of breath at rest right-sided chest discomfort.  Presented to the ED with tachycardia mild hypotension and mild anemia.  Head CT imaging performed with pleural effusion noted.  In the ED developed atrial fibrillation with rapid ventricular response and received IV therapy including metoprolol and amiodarone with subsequent improvement.  Patient was seen by cardiology with additional medications delivered for arrhythmia.  Interventional radiology consultation placed for thoracentesis with pleural fluid studies ordered.  Empirically treated for pneumonia with vancomycin and cefepime as well as Flagyl received resuscitative fluids with LR.  Oncology consultation for severe leucocytosis.  ? ?Patient shares since March 5th on Sunday she developed sore throat and cough and then she got myalgia and malaise. She is has never smoked. She had sister with breast cancer who passed away and anther sister with breast cancer who survived. She reports severe fatigue for at least 5 years progressively worsening.  ? ? ?04/14/21- patient is substantially improved and bloodwork is improved in paralell. Granddaughter [name] was present during interview and physical examination reports feeling well. She is on room air.  ?04/15/21-patient is stable, I met with Son Niel(Gary) Santini to review serial chest imaging. She is agreeable to chest tube, she is NPO post midnight, shes of eliquis.   Gram + cocci on resp culture ? ?04/16/21- patient did well with no overnight events. Resting in chair smiling during  interview and physical examination. ? ?04/17/21- patient is doing well clinically, reviewed chest tube with serosang draining actively.  Spoke with IR we may need additional pigtail placed. Patietn will move body position to allow drainage and hopefully in supine position fluid will be aspirated better. ASO titers elevated consistent with streptoccocal exposure and when coupled with GPC + resp cultures suggest parapneumonic effusion.  WBC count steadily reducing towards normal. ? ? ?04/18/21- Patient further improved, please connect to wall suction -20 when not doing PT. Repeat CXR in am.  ? ?PAST MEDICAL HISTORY  ? ?Past Medical History:  ?Diagnosis Date  ? Cancer [facility])   ? skin  ? ? ? ?SURGICAL HISTORY  ? ?Past Surgical History:  ?Procedure Laterality Date  ? BREAST BIOPSY Bilateral   ? neg  ? ? ? ?FAMILY HISTORY  ? ?Family History  ?Problem Relation Age of Onset  ? Breast cancer Sister 38  ? ? ? ?SOCIAL HISTORY  ? ?Social History  ? ?Tobacco Use  ? Smoking status: Never  ? Smokeless tobacco: Never  ?Substance Use Topics  ? Alcohol use: Not Currently  ? Drug use: Never  ? ? ? ?MEDICATIONS  ? ? ?Home Medication:  ?  ?Current Medication: ? ?Current Facility-Administered Medications:  ?  0.9 %  sodium chloride infusion, , Intravenous, PRN, Sharen Hones, MD, Stopped at 04/15/21 0450 ?  acetaminophen (TYLENOL) tablet 650 mg, 650 mg, Oral, Q6H PRN, 650 mg at 04/19/21 1158 **OR** acetaminophen (TYLENOL) suppository 650 mg, 650 mg, Rectal, Q6H PRN, Cox, Amy N, DO ?  Ampicillin-Sulbactam (UNASYN)  3 g in sodium chloride 0.9 % 100 mL IVPB, 3 g, Intravenous, Q6H, Sharen Hones, MD, Last Rate: 200 mL/hr at 04/19/21 0918, 3 g at 04/19/21 8466 ?  apixaban (ELIQUIS) tablet 2.5 mg, 2.5 mg, Oral, BID, Wynelle Cleveland, RPH, 2.5 mg at 04/19/21 0913 ?  diltiazem (CARDIZEM SR) 12 hr capsule 120 mg, 120 mg, Oral, Q12H, Sharen Hones, MD, 120 mg at 04/19/21 0914 ?  fentaNYL (SUBLIMAZE) injection, , Intravenous, PRN, Sandi Mariscal, MD, Given  by Other at 04/16/21 1419 ?  iron polysaccharides (NIFEREX) capsule 150 mg, 150 mg, Oral, Daily, Sharen Hones, MD, 150 mg at 04/19/21 0914 ?  latanoprost (XALATAN) 0.005 % ophthalmic solution 1 drop, 1 drop, Both Eyes, QHS, Cox, Amy N, DO, 1 drop at 04/18/21 2241 ?  levothyroxine (SYNTHROID) tablet 75 mcg, 75 mcg, Oral, Q0600, Cox, Amy N, DO, 75 mcg at 04/19/21 0524 ?  loperamide (IMODIUM) capsule 2 mg, 2 mg, Oral, PRN, Sharen Hones, MD ?  loratadine (CLARITIN) tablet 10 mg, 10 mg, Oral, Daily, Wieting, Richard, MD, 10 mg at 04/19/21 5993 ?  melatonin tablet 5 mg, 5 mg, Oral, QHS PRN, Foust, Katy L, NP, 5 mg at 04/17/21 2258 ?  metoprolol tartrate (LOPRESSOR) injection 5 mg, 5 mg, Intravenous, Q6H PRN, Sharen Hones, MD, 5 mg at 04/13/21 0941 ?  midazolam (VERSED) injection, , Intravenous, PRN, Sandi Mariscal, MD, Given by Other at 04/16/21 1420 ?  multivitamin with minerals tablet 1 tablet, 1 tablet, Oral, Daily, Cox, Amy N, DO, 1 tablet at 04/19/21 0913 ?  ondansetron (ZOFRAN) tablet 4 mg, 4 mg, Oral, Q6H PRN **OR** ondansetron (ZOFRAN) injection 4 mg, 4 mg, Intravenous, Q6H PRN, Cox, Amy N, DO ?  oxyCODONE-acetaminophen (PERCOCET/ROXICET) 5-325 MG per tablet 0.5 tablet, 0.5 tablet, Oral, Q4H PRN, Sharen Hones, MD, 0.5 tablet at 04/19/21 1158 ?  pantoprazole (PROTONIX) EC tablet 40 mg, 40 mg, Oral, Daily, Cox, Amy N, DO, 40 mg at 04/19/21 0914 ?  sodium chloride flush (NS) 0.9 % injection 3 mL, 3 mL, Intravenous, Q12H, Sharen Hones, MD, 3 mL at 04/19/21 5701 ?  vitamin B-12 (CYANOCOBALAMIN) tablet 1,000 mcg, 1,000 mcg, Oral, Daily, Cox, Amy N, DO, 1,000 mcg at 04/19/21 0913 ? ? ? ?ALLERGIES  ? ?Patient has no known allergies. ? ? ? ? ?REVIEW OF SYSTEMS  ? ? ?Review of Systems: ? ?Gen:  Denies  fever, sweats, chills weigh loss  ?HEENT: Denies blurred vision, double vision, ear pain, eye pain, hearing loss, nose bleeds, sore throat ?Cardiac:  No dizziness, chest pain or heaviness, chest tightness,edema ?Resp:   Denies  cough or sputum porduction, shortness of breath,wheezing, hemoptysis,  ?Gi: Denies swallowing difficulty, stomach pain, nausea or vomiting, diarrhea, constipation, bowel incontinence ?Gu:  Denies bladder incontinence, burning urine ?Ext:   Denies Joint pain, stiffness or swelling ?Skin: Denies  skin rash, easy bruising or bleeding or hives ?Endoc:  Denies polyuria, polydipsia , polyphagia or weight change ?Psych:   Denies depression, insomnia or hallucinations  ? ?Other:  All other systems negative ? ? ?VS: BP (!) 138/58 (BP Location: Right Arm)   Pulse 70   Temp 97.9 ?F (36.6 ?C)   Resp 18   Ht '5\' 5"'  (1.651 m)   Wt 57.2 kg   SpO2 98%   BMI 20.98 kg/m?   ? ? ? ?PHYSICAL EXAM  ? ? ?GENERAL:NAD, no fevers, chills, no weakness no fatigue ?HEAD: Normocephalic, atraumatic.  ?EYES: Pupils equal, round, reactive to light. Extraocular muscles intact. No  scleral icterus.  ?MOUTH: Moist mucosal membrane. Dentition intact. No abscess noted.  ?EAR, NOSE, THROAT: Clear without exudates. No external lesions.  ?NECK: Supple. No thyromegaly. No nodules. No JVD.  ?PULMONARY: mild rhonchi worse at R base ?CARDIOVASCULAR: S1 and S2. Regular rate and rhythm. No murmurs, rubs, or gallops. No edema. Pedal pulses 2+ bilaterally.  ?GASTROINTESTINAL: Soft, nontender, nondistended. No masses. Positive bowel sounds. No hepatosplenomegaly.  ?MUSCULOSKELETAL: No swelling, clubbing, or edema. Range of motion full in all extremities.  ?NEUROLOGIC: Cranial nerves II through XII are intact. No gross focal neurological deficits. Sensation intact. Reflexes intact.  ?SKIN: No ulceration, lesions, rashes, or cyanosis. Skin warm and dry. Turgor intact.  ?PSYCHIATRIC: Mood, affect within normal limits. The patient is awake, alert and oriented x 3. Insight, judgment intact.  ? ? ?  ? ?IMAGING  ? ? ?DG Chest 2 View ? ?Result Date: 04/15/2021 ?CLINICAL DATA:  Parapneumonic effusion. EXAM: CHEST - 2 VIEW COMPARISON:  04/12/2021 and CT chest 03/05/2011.  FINDINGS: Trachea is midline. Heart is enlarged. Thoracic aorta is calcified. Moderate to large loculated right pleural effusion with air-fluid levels. Collapse/consolidation in the right lung base. Biapical pleur

## 2021-04-19 NOTE — TOC CM/SW Note (Signed)
Eliquis coupon provided. ? Oleh Genin, LCSW ?[phone] ? ?

## 2021-04-19 NOTE — Progress Notes (Signed)
Patient medically cleared with discharge orders placed by MD.  Patient received AVS along with education provided by primary RN regarding changes to medications and follow up appointments with all questions and concerns answered by primary RN.  PIVs removed prior to discharge with family at bedside providing transport for patient to home at this time.   ?

## 2021-04-19 NOTE — Discharge Summary (Signed)
?Physician Discharge Summary ?  ?Patient: Katherine Bradshaw MRN: [id number] DOB: 11-21-1940  ?Admit date:     04/11/2021  ?Discharge date: 04/19/21  ?Discharge Physician: Loletha Grayer  ? ?PCP: Rusty Aus, MD  ? ?Recommendations at discharge:  ? ?Follow-up PCP 5 days ?Follow-up with Dr. Lanney Gins one week ? ?Discharge Diagnoses: ?Principal Problem: ?  Parapneumonic effusion ?Active Problems: ?  Severe sepsis ([place]) ?  Leukemoid reaction ?  Paroxysmal atrial fibrillation (HCC) ?  Reactive thrombocytosis ?  Hyponatremia ?  Hypothyroidism ?  Acute on chronic diastolic CHF (congestive heart failure) ([place]) ?  Iron deficiency anemia ? ? ? ?Hospital Course: ?Katherine Bradshaw is a 81 year old female with medical history of hypothyroid, GERD, who presents to the emergency department from [facility] clinic for chief concerns of shortness of breath, right lower quadrant pain, weakness. ?Patient had shingles about a month ago, had upper respiratory infection for the last week.  Started have a cough and shortness of breath.  Also had right lower quadrant abdominal pain, which he has resolved after 2 episode of diarrhea at early morning of 3/10. ?Upon arriving the hospital, she had severe leukocytosis with white cell count 72.2, reactive thrombocytosis, lactic acidosis of 2.7.  Sodium 123.  Her vital signs showed atrial fibrillation with RVR 126, tachypnea.  She was placed on broad-spectrum antibiotics for severe sepsis.  Her CT scan of abdomen/pelvis did not show any acute changes.  Lower chest had a small loculated pleural effusion.  However on chest x-ray, there is very small amount of pleural effusion. ?Thoracentesis was performed on 3/10, removed 50 mL of fluids.  LDH 3085, neutrophil 90% with total white cell 5842. ? ?3/12.  Condition improving, antibiotic changed to Unasyn. ?3/13.  Repeated chest x-ray showed reaccumulation of right-sided pleural effusion.  Chest tube is placed 3/14.  Case discussed with interventional  radiology and pulmonary and chest tube was removed on 04/19/2021.  Patient still has a little residual right pleural effusion. ? ?Assessment and Plan: ?* Parapneumonic effusion ?Thoracentesis culture negative.  CT scan of the chest concerning for empyema.  Chest tube placed on 04/16/2021.  Chest tube removed on 04/19/2021 after speaking with IR and pulmonary.  Patient received Unasyn while here.  Case discussed with infectious disease pharmacist and will give 3 more weeks of antibiotics upon disposition.  Antibiotics changed over to Augmentin.  Close follow-up with pulmonary as outpatient.  With ASO titers elevated likely secondary to Streptococcus. ? ? ?Severe sepsis ([place]) ?Present on admission.  Patient met severe sepsis criteria with severe leukocytosis, tachypnea and tachycardia.  Patient also has elevated lactic acid.  Blood cultures are negative.  Condition has improved. ? ?Leukemoid reaction ?Patient initial white cell count was 86,000, patient was initially evaluated by hematology, flow cytometry did not show any evidence of malignancy.  This appears to be secondary to severe sepsis.  Condition has improved.  Last white count 14.1. ? ?Paroxysmal atrial fibrillation (HCC) ?Paroxysmal in nature.  Has been in sinus rhythm since the 12th.  Patient on Eliquis for anticoagulation.  Benefits and risks of blood thinner explained to patient.  Diltiazem for rate control. ? ? ?Reactive thrombocytosis ?Platelet count up at 671.  Recommend repeating CBC as outpatient ? ?Hyponatremia ?Sodium 132 upon discharge ? ?Acute on chronic diastolic CHF (congestive heart failure) ([place]) ?Echocardiogram showed normal ejection fraction, elevated BNP probably from volume.  She received IV Lasix.  Holding off on further Lasix at this point.  I personally think  this is likely more pleural effusion related rather than heart failure. ? ?Hypothyroidism ?Continue Synthroid. ? ?Iron deficiency anemia ?Continue oral iron.  Last hemoglobin  10.3 ? ? ? ? ?  ? ? ?Consultants: Interventional radiology, pulmonary ?Procedures performed: Thoracentesis, chest tube placement, chest tube removal ?Disposition: Home ?Diet recommendation:  ?Regular diet ?DISCHARGE MEDICATION: ?Allergies as of 04/19/2021   ?No Known Allergies ?  ? ?  ?Medication List  ?  ? ?TAKE these medications   ? ?amoxicillin-clavulanate 875-125 MG tablet ?Commonly known as: Augmentin ?Take 1 tablet by mouth 2 (two) times daily for 21 days. ?  ?apixaban 2.5 MG Tabs tablet ?Commonly known as: ELIQUIS ?Take 1 tablet (2.5 mg total) by mouth 2 (two) times daily. ?  ?diltiazem 120 MG 12 hr capsule ?Commonly known as: CARDIZEM SR ?Take 1 capsule (120 mg total) by mouth every 12 (twelve) hours. ?  ?iron polysaccharides 150 MG capsule ?Commonly known as: NIFEREX ?Take 1 capsule (150 mg total) by mouth daily. ?Start taking on: April 20, 2021 ?  ?latanoprost 0.005 % ophthalmic solution ?Commonly known as: XALATAN ?Place 1 drop into both eyes at bedtime. ?  ?levothyroxine 75 MCG tablet ?Commonly known as: SYNTHROID ?Take 75 mcg by mouth every morning. ?  ?loratadine 10 MG tablet ?Commonly known as: CLARITIN ?Take 1 tablet (10 mg total) by mouth daily. ?Start taking on: April 20, 2021 ?  ?multivitamin with minerals tablet ?Take 1 tablet by mouth daily. ?  ?pantoprazole 40 MG tablet ?Commonly known as: PROTONIX ?Take 40 mg by mouth daily. ?  ?vitamin B-12 1000 MCG tablet ?Commonly known as: CYANOCOBALAMIN ?Take 1,000 mcg by mouth daily. ?  ? ?  ? ? Follow-up Information   ? ? Lloyd Huger, MD Follow up on 04/30/2021.   ?Specialty: Oncology ?Why: @ 1:15pm ?Contact information: ?[address] ?[place] [address] ?[phone] ? ? ?  ?  ? ? Rusty Aus, MD. Go on 04/26/2021.   ?Specialty: Internal Medicine ?Why: 11am appointment ?Contact information: ?[address] ?[facility] ?[place] [address] ?[phone] ? ? ?  ?  ? ? Ottie Glazier, MD. Daphane Shepherd on 05/01/2021.    ?Specialty: Pulmonary Disease ?Why: 10:45am appointment ?Contact information: ?[address] ?[place] [address] ?[phone] ? ? ?  ?  ? ?  ?  ? ?  ? ?Discharge Exam: ?Filed Weights  ? 04/11/21 1454 04/12/21 0714 04/16/21 0100  ?Weight: 57.2 kg 58.5 kg 57.2 kg  ? ?Physical Exam ?HENT:  ?   Head: Normocephalic.  ?   Mouth/Throat:  ?   Pharynx: No oropharyngeal exudate.  ?Eyes:  ?   General: Lids are normal.  ?   Conjunctiva/sclera: Conjunctivae normal.  ?Cardiovascular:  ?   Rate and Rhythm: Normal rate and regular rhythm.  ?   Heart sounds: Normal heart sounds, S1 normal and S2 normal.  ?Pulmonary:  ?   Breath sounds: Examination of the right-lower field reveals decreased breath sounds. Decreased breath sounds present. No wheezing, rhonchi or rales.  ?Abdominal:  ?   Palpations: Abdomen is soft.  ?   Tenderness: There is no abdominal tenderness.  ?Musculoskeletal:  ?   Right lower leg: No swelling.  ?   Left lower leg: No swelling.  ?Skin: ?   General: Skin is warm.  ?   Findings: No rash.  ?Neurological:  ?   Mental Status: She is alert and oriented to person, place, and time.  ?  ? ?Condition  at discharge: stable ? ?The results of significant diagnostics from this hospitalization (including imaging, microbiology, ancillary and laboratory) are listed below for reference.  ? ?Imaging Studies: ?DG Chest 2 View ? ?Result Date: 04/15/2021 ?CLINICAL DATA:  Parapneumonic effusion. EXAM: CHEST - 2 VIEW COMPARISON:  04/12/2021 and CT chest 03/05/2011. FINDINGS: Trachea is midline. Heart is enlarged. Thoracic aorta is calcified. Moderate to large loculated right pleural effusion with air-fluid levels. Collapse/consolidation in the right lung base. Biapical pleural thickening. Trace left pleural effusion. IMPRESSION: 1. Moderate to large loculated right pleural effusion with air-fluid levels. Amount of air is greater than expected for recent thoracentesis. Empyema can have this appearance. Bronchopleural or  alveolar pleural fistula cannot be excluded. These results will be called to the ordering clinician or representative by the Radiologist Assistant, and communication documented in the PACS or [employer]. 2. Rig

## 2021-04-19 NOTE — Care Management Important Message (Signed)
Important Message ? ?Patient Details  ?Name: Katherine Bradshaw ?MRN: [id number] ?Date of Birth: 05-13-1940 ? ? ?Medicare Important Message Given:  Yes ? ? ? ? ?[name] ?04/19/2021, 10:48 AM ?

## 2021-04-20 LAB — BODY FLUID CULTURE W GRAM STAIN
Culture: NO GROWTH
Gram Stain: NONE SEEN

## 2021-04-21 LAB — BCR-ABL1, CML/ALL, PCR, QUANT: Interpretation (BCRAL):: NEGATIVE

## 2021-04-22 ENCOUNTER — Other Ambulatory Visit: Payer: Self-pay | Admitting: *Deleted

## 2021-04-22 DIAGNOSIS — D509 Iron deficiency anemia, unspecified: Secondary | ICD-10-CM

## 2021-04-26 DIAGNOSIS — J918 Pleural effusion in other conditions classified elsewhere: Secondary | ICD-10-CM | POA: Diagnosis not present

## 2021-04-26 DIAGNOSIS — J189 Pneumonia, unspecified organism: Secondary | ICD-10-CM | POA: Diagnosis not present

## 2021-04-26 DIAGNOSIS — J13 Pneumonia due to Streptococcus pneumoniae: Secondary | ICD-10-CM | POA: Diagnosis not present

## 2021-04-30 ENCOUNTER — Inpatient Hospital Stay: Payer: PPO | Admitting: Oncology

## 2021-04-30 ENCOUNTER — Encounter: Payer: Self-pay | Admitting: Oncology

## 2021-04-30 ENCOUNTER — Other Ambulatory Visit: Payer: Self-pay

## 2021-04-30 ENCOUNTER — Inpatient Hospital Stay: Payer: PPO | Attending: Oncology

## 2021-04-30 VITALS — BP 122/66 | HR 76 | Temp 97.0°F | Resp 17 | Wt 122.0 lb

## 2021-04-30 DIAGNOSIS — I4891 Unspecified atrial fibrillation: Secondary | ICD-10-CM | POA: Insufficient documentation

## 2021-04-30 DIAGNOSIS — D509 Iron deficiency anemia, unspecified: Secondary | ICD-10-CM | POA: Diagnosis not present

## 2021-04-30 DIAGNOSIS — Z79899 Other long term (current) drug therapy: Secondary | ICD-10-CM | POA: Insufficient documentation

## 2021-04-30 DIAGNOSIS — Z7901 Long term (current) use of anticoagulants: Secondary | ICD-10-CM | POA: Insufficient documentation

## 2021-04-30 LAB — CBC WITH DIFFERENTIAL/PLATELET
Abs Immature Granulocytes: 0.07 10*3/uL (ref 0.00–0.07)
Basophils Absolute: 0.1 10*3/uL (ref 0.0–0.1)
Basophils Relative: 1 %
Eosinophils Absolute: 0.2 10*3/uL (ref 0.0–0.5)
Eosinophils Relative: 2 %
HCT: 28.1 % — ABNORMAL LOW (ref 36.0–46.0)
Hemoglobin: 9 g/dL — ABNORMAL LOW (ref 12.0–15.0)
Immature Granulocytes: 1 %
Lymphocytes Relative: 26 %
Lymphs Abs: 2.1 10*3/uL (ref 0.7–4.0)
MCH: 28.7 pg (ref 26.0–34.0)
MCHC: 32 g/dL (ref 30.0–36.0)
MCV: 89.5 fL (ref 80.0–100.0)
Monocytes Absolute: 1 10*3/uL (ref 0.1–1.0)
Monocytes Relative: 12 %
Neutro Abs: 4.8 10*3/uL (ref 1.7–7.7)
Neutrophils Relative %: 58 %
Platelets: 550 10*3/uL — ABNORMAL HIGH (ref 150–400)
RBC: 3.14 MIL/uL — ABNORMAL LOW (ref 3.87–5.11)
RDW: 14.6 % (ref 11.5–15.5)
WBC: 8.3 10*3/uL (ref 4.0–10.5)
nRBC: 0 % (ref 0.0–0.2)

## 2021-04-30 LAB — COMPREHENSIVE METABOLIC PANEL
ALT: 20 U/L (ref 0–44)
AST: 22 U/L (ref 15–41)
Albumin: 2.9 g/dL — ABNORMAL LOW (ref 3.5–5.0)
Alkaline Phosphatase: 71 U/L (ref 38–126)
Anion gap: 6 (ref 5–15)
BUN: 17 mg/dL (ref 8–23)
CO2: 27 mmol/L (ref 22–32)
Calcium: 8.6 mg/dL — ABNORMAL LOW (ref 8.9–10.3)
Chloride: 97 mmol/L — ABNORMAL LOW (ref 98–111)
Creatinine, Ser: 0.58 mg/dL (ref 0.44–1.00)
GFR, Estimated: >60 mL/min (ref >60)
Glucose, Bld: 111 mg/dL — ABNORMAL HIGH (ref 70–99)
Potassium: 4.2 mmol/L (ref 3.5–5.1)
Sodium: 130 mmol/L — ABNORMAL LOW (ref 135–145)
Total Bilirubin: <0.1 mg/dL — ABNORMAL LOW (ref 0.3–1.2)
Total Protein: 7.7 g/dL (ref 6.5–8.1)

## 2021-04-30 LAB — MISC LABCORP TEST (SEND OUT): Labcorp test code: 9985

## 2021-04-30 NOTE — Progress Notes (Signed)
?[place]  ?[phone] [phone] ? ?ID: Katherine Bradshaw OB: 03-05-1940  MR#: [id number]  MOQ#:[id number] ? ?Patient Care Team: ?Rusty Aus, MD as PCP - General (Internal Medicine) ? ?CHIEF COMPLAINT: Leukocytosis, iron deficiency anemia. ? ?INTERVAL HISTORY: Patient returns to clinic today for repeat laboratory work and hospital follow-up.  She feels significantly improved since discharge and nearly back to her baseline.  She does not complain of any shortness of breath or weakness and fatigue today. She has no neurologic complaints.  She denies any fevers.  She has a fair appetite, but denies weight loss.  She denies any chest pain, shortness of breath, cough, or hemoptysis.  She denies any nausea, vomiting, constipation, or diarrhea. She has no urinary complaints.  Patient offers no specific complaints today. ? ?REVIEW OF SYSTEMS:   ?Review of Systems  ?Constitutional: Negative.  Negative for fever, malaise/fatigue and weight loss.  ?Respiratory: Negative.  Negative for cough, hemoptysis and shortness of breath.   ?Cardiovascular: Negative.  Negative for chest pain and leg swelling.  ?Gastrointestinal: Negative.  Negative for abdominal pain.  ?Genitourinary: Negative.  Negative for dysuria.  ?Musculoskeletal: Negative.  Negative for back pain.  ?Skin: Negative.  Negative for rash.  ?Neurological: Negative.  Negative for dizziness, focal weakness, weakness and headaches.  ?Psychiatric/Behavioral: Negative.  The patient is not nervous/anxious.   ? ?As per HPI. Otherwise, a complete review of systems is negative. ? ?PAST MEDICAL HISTORY: ?Past Medical History:  ?Diagnosis Date  ? Acid reflux   ? Cancer [facility])   ? skin  ? Heart palpitations   ? Thyroid disease 2019  ? ? ?PAST SURGICAL HISTORY: ?Past Surgical History:  ?Procedure Laterality Date  ? BREAST BIOPSY Bilateral   ? neg  ? colonoscopy    ? CYSTECTOMY  1990  ? cysts removal on side  ? VAGINAL HYSTERECTOMY  1989   ? ? ?FAMILY HISTORY: ?Family History  ?Problem Relation Age of Onset  ? Alzheimer's disease Mother   ? Stroke Father   ? Diabetes Father   ? Diabetes Sister   ? Breast cancer Sister 69  ? Diabetes Brother   ? ? ?ADVANCED DIRECTIVES (Y/N):  N ? ?HEALTH MAINTENANCE: ?Social History  ? ?Tobacco Use  ? Smoking status: Never  ? Smokeless tobacco: Never  ?Substance Use Topics  ? Alcohol use: Not Currently  ? Drug use: Never  ? ? ? Colonoscopy: ? PAP: ? Bone density: ? Lipid panel: ? ?No Known Allergies ? ?Current Outpatient Medications  ?Medication Sig Dispense Refill  ? amoxicillin-clavulanate (AUGMENTIN) 875-125 MG tablet Take 1 tablet by mouth 2 (two) times daily for 21 days. 42 tablet 0  ? apixaban (ELIQUIS) 2.5 MG TABS tablet Take 1 tablet (2.5 mg total) by mouth 2 (two) times daily. 60 tablet 0  ? diltiazem (CARDIZEM SR) 120 MG 12 hr capsule Take 1 capsule (120 mg total) by mouth every 12 (twelve) hours. 60 capsule 0  ? iron polysaccharides (NIFEREX) 150 MG capsule Take 1 capsule (150 mg total) by mouth daily. 30 capsule 0  ? latanoprost (XALATAN) 0.005 % ophthalmic solution Place 1 drop into both eyes at bedtime.    ? levothyroxine (SYNTHROID) 75 MCG tablet Take 75 mcg by mouth every morning.    ? loratadine (CLARITIN) 10 MG tablet Take 1 tablet (10 mg total) by mouth daily. 30 tablet 0  ? Multiple Vitamins-Minerals (MULTIVITAMIN WITH MINERALS) tablet Take 1 tablet by mouth daily.    ? pantoprazole (  PROTONIX) 40 MG tablet Take 40 mg by mouth daily.    ? vitamin B-12 (CYANOCOBALAMIN) 1000 MCG tablet Take 1,000 mcg by mouth daily.    ? ?No current facility-administered medications for this visit.  ? ? ?OBJECTIVE: ?Vitals:  ? 04/30/21 1403  ?BP: 122/66  ?Pulse: 76  ?Resp: 17  ?Temp: (!) 97 ?F (36.1 ?C)  ?SpO2: 96%  ?   Body mass index is 20.3 kg/m?Marland Kitchen    ECOG FS:0 - Asymptomatic ? ?General: Well-developed, well-nourished, no acute distress. ?Eyes: Pink conjunctiva, anicteric sclera. ?HEENT: Normocephalic, moist mucous  membranes. ?Lungs: No audible wheezing or coughing. ?Heart: Regular rate and rhythm. ?Abdomen: Soft, nontender, no obvious distention. ?Musculoskeletal: No edema, cyanosis, or clubbing. ?Neuro: Alert, answering all questions appropriately. Cranial nerves grossly intact. ?Skin: No rashes or petechiae noted. ?Psych: Normal affect. ?Lymphatics: No cervical, calvicular, axillary or inguinal LAD. ? ? ?LAB RESULTS: ? ?Lab Results  ?Component Value Date  ? NA 130 (L) 04/30/2021  ? K 4.2 04/30/2021  ? CL 97 (L) 04/30/2021  ? CO2 27 04/30/2021  ? GLUCOSE 111 (H) 04/30/2021  ? BUN 17 04/30/2021  ? CREATININE 0.58 04/30/2021  ? CALCIUM 8.6 (L) 04/30/2021  ? PROT 7.7 04/30/2021  ? ALBUMIN 2.9 (L) 04/30/2021  ? AST 22 04/30/2021  ? ALT 20 04/30/2021  ? ALKPHOS 71 04/30/2021  ? BILITOT <0.1 (L) 04/30/2021  ? GFRNONAA >60 04/30/2021  ? ? ?Lab Results  ?Component Value Date  ? WBC 8.3 04/30/2021  ? NEUTROABS 4.8 04/30/2021  ? HGB 9.0 (L) 04/30/2021  ? HCT 28.1 (L) 04/30/2021  ? MCV 89.5 04/30/2021  ? PLT 550 (H) 04/30/2021  ? ?Lab Results  ?Component Value Date  ? IRON 14 (L) 04/12/2021  ? TIBC 196 (L) 04/12/2021  ? IRONPCTSAT 7 (L) 04/12/2021  ? ?Lab Results  ?Component Value Date  ? FERRITIN 164 04/12/2021  ? ? ? ?STUDIES: ?DG Chest 2 View ? ?Result Date: 04/15/2021 ?CLINICAL DATA:  Parapneumonic effusion. EXAM: CHEST - 2 VIEW COMPARISON:  04/12/2021 and CT chest 03/05/2011. FINDINGS: Trachea is midline. Heart is enlarged. Thoracic aorta is calcified. Moderate to large loculated right pleural effusion with air-fluid levels. Collapse/consolidation in the right lung base. Biapical pleural thickening. Trace left pleural effusion. IMPRESSION: 1. Moderate to large loculated right pleural effusion with air-fluid levels. Amount of air is greater than expected for recent thoracentesis. Empyema can have this appearance. Bronchopleural or alveolar pleural fistula cannot be excluded. These results will be called to the ordering clinician or  representative by the Radiologist Assistant, and communication documented in the PACS or [employer]. 2. Right basilar collapse/consolidation, possibly due to pneumonia. Followup PA and lateral chest X-ray is recommended in 3-4 weeks following trial of antibiotic therapy to ensure resolution and exclude underlying malignancy. 3. Trace left pleural effusion. Electronically Signed   By: Lorin Picket M.D.   On: 04/15/2021 09:51  ? ?CT CHEST W CONTRAST ? ?Result Date: 04/15/2021 ?CLINICAL DATA:  Empyema. EXAM: CT CHEST WITH CONTRAST TECHNIQUE: Multidetector CT imaging of the chest was performed during intravenous contrast administration. RADIATION DOSE REDUCTION: This exam was performed according to the departmental dose-optimization program which includes automated exposure control, adjustment of the mA and/or kV according to patient size and/or use of iterative reconstruction technique. CONTRAST:  49m OMNIPAQUE IOHEXOL 300 MG/ML  SOLN COMPARISON:  03/05/2011.  Abdomen pelvis CT 04/11/2021 FINDINGS: Cardiovascular: The heart size is normal. No substantial pericardial effusion. Mild atherosclerotic calcification is noted in the wall of  the thoracic aorta. Mediastinum/Nodes: Mild mediastinal lymphadenopathy evident. 10 mm short axis AP window lymph node identified on 53/2. 14 mm short axis subcarinal lymph node evident. Mild lymphadenopathy noted right hilum. The esophagus has normal imaging features. There is no axillary lymphadenopathy. Lungs/Pleura: Loculated collection gas and fluid identified posterior right pleural space with loculated component subpleural fluid seen laterally and anteriorly at the right base. Basilar atelectasis noted in the right middle and lower lobes. No suspicious nodule or mass on the left. No left pleural effusion. Upper Abdomen: Unremarkable. Musculoskeletal: No worrisome lytic or sclerotic osseous abnormality. IMPRESSION: 1. Loculated gas and fluid collection in the posterior right  pleural space with loculated component seen laterally and anteriorly at the right base. Imaging features compatible with reported history of empyema. 2. Mild mediastinal and right hilar lymphadenopathy

## 2021-04-30 NOTE — Progress Notes (Signed)
Patient here for oncology follow-up appointment, concerns of dark stools and chest discomfort    ?

## 2021-05-01 DIAGNOSIS — J90 Pleural effusion, not elsewhere classified: Secondary | ICD-10-CM | POA: Diagnosis not present

## 2021-05-01 DIAGNOSIS — I700 Atherosclerosis of aorta: Secondary | ICD-10-CM | POA: Diagnosis not present

## 2021-05-01 DIAGNOSIS — R0602 Shortness of breath: Secondary | ICD-10-CM | POA: Diagnosis not present

## 2021-05-02 ENCOUNTER — Encounter: Payer: Self-pay | Admitting: Oncology

## 2021-05-06 ENCOUNTER — Inpatient Hospital Stay: Payer: PPO | Attending: Oncology

## 2021-05-06 VITALS — BP 135/55 | HR 65 | Temp 97.8°F | Resp 18

## 2021-05-06 DIAGNOSIS — D509 Iron deficiency anemia, unspecified: Secondary | ICD-10-CM | POA: Diagnosis not present

## 2021-05-06 MED ORDER — SODIUM CHLORIDE 0.9 % IV SOLN
Freq: Once | INTRAVENOUS | Status: AC
  Filled 2021-05-06: qty 250

## 2021-05-06 MED ORDER — SODIUM CHLORIDE 0.9 % IV SOLN: 200.0000 mg | Freq: Once | INTRAVENOUS | Status: DC

## 2021-05-06 MED ORDER — IRON SUCROSE 20 MG/ML IV SOLN
200.0000 mg | Freq: Once | INTRAVENOUS | Status: AC
  Administered 2021-05-06: 200 mg via INTRAVENOUS
  Filled 2021-05-06: qty 10

## 2021-05-09 ENCOUNTER — Inpatient Hospital Stay: Payer: PPO

## 2021-05-09 VITALS — BP 134/62 | HR 69 | Temp 97.7°F | Resp 18

## 2021-05-09 DIAGNOSIS — D509 Iron deficiency anemia, unspecified: Secondary | ICD-10-CM | POA: Diagnosis not present

## 2021-05-09 MED ORDER — SODIUM CHLORIDE 0.9 % IV SOLN
Freq: Once | INTRAVENOUS | Status: AC
  Filled 2021-05-09: qty 250

## 2021-05-09 MED ORDER — IRON SUCROSE 20 MG/ML IV SOLN
200.0000 mg | Freq: Once | INTRAVENOUS | Status: AC
  Administered 2021-05-09: 200 mg via INTRAVENOUS
  Filled 2021-05-09: qty 10

## 2021-05-09 MED ORDER — SODIUM CHLORIDE 0.9 % IV SOLN: 200.0000 mg | Freq: Once | INTRAVENOUS | Status: DC

## 2021-05-09 NOTE — Patient Instructions (Signed)

## 2021-05-13 ENCOUNTER — Inpatient Hospital Stay: Payer: PPO

## 2021-05-13 VITALS — BP 126/55 | HR 68 | Temp 96.2°F | Resp 20

## 2021-05-13 DIAGNOSIS — D509 Iron deficiency anemia, unspecified: Secondary | ICD-10-CM | POA: Diagnosis not present

## 2021-05-13 MED ORDER — IRON SUCROSE 20 MG/ML IV SOLN
200.0000 mg | Freq: Once | INTRAVENOUS | Status: AC
  Administered 2021-05-13: 200 mg via INTRAVENOUS
  Filled 2021-05-13: qty 10

## 2021-05-13 MED ORDER — SODIUM CHLORIDE 0.9 % IV SOLN: 200.0000 mg | Freq: Once | INTRAVENOUS | Status: DC

## 2021-05-13 MED ORDER — SODIUM CHLORIDE 0.9 % IV SOLN
Freq: Once | INTRAVENOUS | Status: AC
  Filled 2021-05-13: qty 250

## 2021-05-13 NOTE — Patient Instructions (Signed)

## 2021-05-13 NOTE — Progress Notes (Signed)
Patient tolerated Venofer infusion well, no concerns voiced. Patient stable at discharge. AVS given.   ?

## 2021-05-16 ENCOUNTER — Inpatient Hospital Stay: Payer: PPO

## 2021-05-16 VITALS — BP 122/54 | HR 71 | Temp 99.0°F | Resp 16

## 2021-05-16 DIAGNOSIS — D509 Iron deficiency anemia, unspecified: Secondary | ICD-10-CM | POA: Diagnosis not present

## 2021-05-16 MED ORDER — IRON SUCROSE 20 MG/ML IV SOLN
200.0000 mg | Freq: Once | INTRAVENOUS | Status: AC
  Administered 2021-05-16: 200 mg via INTRAVENOUS
  Filled 2021-05-16: qty 10

## 2021-05-16 MED ORDER — SODIUM CHLORIDE 0.9 % IV SOLN
Freq: Once | INTRAVENOUS | Status: AC
  Filled 2021-05-16: qty 250

## 2021-05-16 MED ORDER — SODIUM CHLORIDE 0.9 % IV SOLN: 200.0000 mg | Freq: Once | INTRAVENOUS | Status: DC

## 2021-05-20 ENCOUNTER — Inpatient Hospital Stay: Payer: PPO

## 2021-05-20 VITALS — BP 119/47 | HR 66 | Temp 98.6°F | Resp 16

## 2021-05-20 DIAGNOSIS — D509 Iron deficiency anemia, unspecified: Secondary | ICD-10-CM

## 2021-05-20 MED ORDER — IRON SUCROSE 20 MG/ML IV SOLN
200.0000 mg | Freq: Once | INTRAVENOUS | Status: AC
  Administered 2021-05-20: 200 mg via INTRAVENOUS
  Filled 2021-05-20: qty 10

## 2021-05-20 MED ORDER — SODIUM CHLORIDE 0.9 % IV SOLN: 200.0000 mg | Freq: Once | INTRAVENOUS | Status: DC

## 2021-05-20 MED ORDER — SODIUM CHLORIDE 0.9 % IV SOLN
Freq: Once | INTRAVENOUS | Status: AC
  Filled 2021-05-20: qty 250

## 2021-05-20 NOTE — Patient Instructions (Signed)
[facility]  Discharge Instructions: ?Thank you for choosing [facility] to provide your oncology and hematology care.  ?If you have a lab appointment with the Cancer Center, please go directly to the Cancer Center and check in at the registration area. ? ?Wear comfortable clothing and clothing appropriate for easy access to any Portacath or PICC line.  ? ?We strive to give you quality time with your provider. You may need to reschedule your appointment if you arrive late (15 or more minutes).  Arriving late affects you and other patients whose appointments are after yours.  Also, if you miss three or more appointments without notifying the office, you may be dismissed from the clinic at the provider?s discretion.    ?  ?For prescription refill requests, have your pharmacy contact our office and allow 72 hours for refills to be completed.   ? ?Today you received the following chemotherapy and/or immunotherapy agents VENOFER ?    ?  ?To help prevent nausea and vomiting after your treatment, we encourage you to take your nausea medication as directed. ? ?BELOW ARE SYMPTOMS THAT SHOULD BE REPORTED IMMEDIATELY: ?*FEVER GREATER THAN 100.4 F (38 ?C) OR HIGHER ?*CHILLS OR SWEATING ?*NAUSEA AND VOMITING THAT IS NOT CONTROLLED WITH YOUR NAUSEA MEDICATION ?*UNUSUAL SHORTNESS OF BREATH ?*UNUSUAL BRUISING OR BLEEDING ?*URINARY PROBLEMS (pain or burning when urinating, or frequent urination) ?*BOWEL PROBLEMS (unusual diarrhea, constipation, pain near the anus) ?TENDERNESS IN MOUTH AND THROAT WITH OR WITHOUT PRESENCE OF ULCERS (sore throat, sores in mouth, or a toothache) ?UNUSUAL RASH, SWELLING OR PAIN  ?UNUSUAL VAGINAL DISCHARGE OR ITCHING  ? ?Items with * indicate a potential emergency and should be followed up as soon as possible or go to the Emergency Department if any problems should occur. ? ?Please show the CHEMOTHERAPY ALERT CARD or IMMUNOTHERAPY ALERT CARD at check-in to  the Emergency Department and triage nurse. ? ?Should you have questions after your visit or need to cancel or reschedule your appointment, please contact [facility]  [phone] and follow the prompts.  Office hours are 8:00 a.m. to 4:30 p.m. Monday - Friday. Please note that voicemails left after 4:00 p.m. may not be returned until the following business day.  We are closed weekends and major holidays. You have access to a nurse at all times for urgent questions. Please call the main number to the clinic [phone] and follow the prompts. ? ?For any non-urgent questions, you may also contact your provider using MyChart. We now offer e-Visits for anyone 18 and older to request care online for non-urgent symptoms. For details visit [email or web address]. ?  ?Also download the MyChart app! Go to the app store, search "MyChart", open the app, select [facility], and log in with your MyChart username and password. ? ?Due to Covid, a mask is required upon entering the hospital/clinic. If you do not have a mask, one will be given to you upon arrival. For doctor visits, patients may have 1 support person aged 18 or older with them. For treatment visits, patients cannot have anyone with them due to current Covid guidelines and our immunocompromised population.  ? ?Iron Sucrose Injection ?What is this medication? ?IRON SUCROSE (EYE ern SOO krose) treats low levels of iron (iron deficiency anemia) in people with kidney disease. Iron is a mineral that plays an important role in making red blood cells, which carry oxygen from your lungs to the rest of your body. ?This medicine   may be used for other purposes; ask your health care provider or pharmacist if you have questions. ?COMMON BRAND NAME(S): Venofer ?What should I tell my care team before I take this medication? ?They need to know if you have any of these conditions: ?Anemia not caused by low iron levels ?Heart disease ?High levels of  iron in the blood ?Kidney disease ?Liver disease ?An unusual or allergic reaction to iron, other medications, foods, dyes, or preservatives ?Pregnant or trying to get pregnant ?Breast-feeding ?How should I use this medication? ?This medication is for infusion into a vein. It is given in a hospital or clinic setting. ?Talk to your care team about the use of this medication in children. While this medication may be prescribed for children as young as 2 years for selected conditions, precautions do apply. ?Overdosage: If you think you have taken too much of this medicine contact a poison control center or emergency room at once. ?NOTE: This medicine is only for you. Do not share this medicine with others. ?What if I miss a dose? ?It is important not to miss your dose. Call your care team if you are unable to keep an appointment. ?What may interact with this medication? ?Do not take this medication with any of the following: ?Deferoxamine ?Dimercaprol ?Other iron products ?This medication may also interact with the following: ?Chloramphenicol ?Deferasirox ?This list may not describe all possible interactions. Give your health care provider a list of all the medicines, herbs, non-prescription drugs, or dietary supplements you use. Also tell them if you smoke, drink alcohol, or use illegal drugs. Some items may interact with your medicine. ?What should I watch for while using this medication? ?Visit your care team regularly. Tell your care team if your symptoms do not start to get better or if they get worse. You may need blood work done while you are taking this medication. ?You may need to follow a special diet. Talk to your care team. Foods that contain iron include: whole grains/cereals, dried fruits, beans, or peas, leafy green vegetables, and organ meats (liver, kidney). ?What side effects may I notice from receiving this medication? ?Side effects that you should report to your care team as soon as  possible: ?Allergic reactions--skin rash, itching, hives, swelling of the face, lips, tongue, or throat ?Low blood pressure--dizziness, feeling faint or lightheaded, blurry vision ?Shortness of breath ?Side effects that usually do not require medical attention (report to your care team if they continue or are bothersome): ?Flushing ?Headache ?Joint pain ?Muscle pain ?Nausea ?Pain, redness, or irritation at injection site ?This list may not describe all possible side effects. Call your doctor for medical advice about side effects. You may report side effects to FDA at 1-800-FDA-1088. ?Where should I keep my medication? ?This medication is given in a hospital or clinic and will not be stored at home. ?NOTE: This sheet is a summary. It may not cover all possible information. If you have questions about this medicine, talk to your doctor, pharmacist, or health care provider. ?? 2023 Elsevier/Gold Standard (2020-06-15 00:00:00) ? ?

## 2021-05-21 DIAGNOSIS — E782 Mixed hyperlipidemia: Secondary | ICD-10-CM | POA: Diagnosis not present

## 2021-05-28 DIAGNOSIS — I4891 Unspecified atrial fibrillation: Secondary | ICD-10-CM | POA: Diagnosis not present

## 2021-05-28 DIAGNOSIS — J90 Pleural effusion, not elsewhere classified: Secondary | ICD-10-CM | POA: Diagnosis not present

## 2021-05-28 DIAGNOSIS — E039 Hypothyroidism, unspecified: Secondary | ICD-10-CM | POA: Diagnosis not present

## 2021-05-28 DIAGNOSIS — I4892 Unspecified atrial flutter: Secondary | ICD-10-CM | POA: Diagnosis not present

## 2021-05-28 DIAGNOSIS — J869 Pyothorax without fistula: Secondary | ICD-10-CM | POA: Diagnosis not present

## 2021-05-28 DIAGNOSIS — Z0000 Encounter for general adult medical examination without abnormal findings: Secondary | ICD-10-CM | POA: Diagnosis not present

## 2021-05-29 ENCOUNTER — Other Ambulatory Visit: Payer: Self-pay | Admitting: Internal Medicine

## 2021-05-29 DIAGNOSIS — Z1231 Encounter for screening mammogram for malignant neoplasm of breast: Secondary | ICD-10-CM

## 2021-07-05 DIAGNOSIS — H401122 Primary open-angle glaucoma, left eye, moderate stage: Secondary | ICD-10-CM | POA: Diagnosis not present

## 2021-07-09 ENCOUNTER — Ambulatory Visit
Admission: RE | Admit: 2021-07-09 | Discharge: 2021-07-09 | Disposition: A | Discharge status: Home / Self Care | Payer: PPO | Source: Ambulatory Visit | Attending: Internal Medicine | Admitting: Internal Medicine

## 2021-07-09 DIAGNOSIS — Z1231 Encounter for screening mammogram for malignant neoplasm of breast: Secondary | ICD-10-CM | POA: Insufficient documentation

## 2021-07-22 DIAGNOSIS — E039 Hypothyroidism, unspecified: Secondary | ICD-10-CM | POA: Diagnosis not present

## 2021-07-29 DIAGNOSIS — R0781 Pleurodynia: Secondary | ICD-10-CM | POA: Diagnosis not present

## 2021-07-29 DIAGNOSIS — I480 Paroxysmal atrial fibrillation: Secondary | ICD-10-CM | POA: Diagnosis not present

## 2021-07-29 DIAGNOSIS — E039 Hypothyroidism, unspecified: Secondary | ICD-10-CM | POA: Diagnosis not present

## 2021-07-29 DIAGNOSIS — R079 Chest pain, unspecified: Secondary | ICD-10-CM | POA: Diagnosis not present

## 2021-08-21 DIAGNOSIS — R0602 Shortness of breath: Secondary | ICD-10-CM | POA: Diagnosis not present

## 2021-08-22 ENCOUNTER — Encounter: Payer: Self-pay | Admitting: Oncology

## 2021-08-28 ENCOUNTER — Other Ambulatory Visit: Payer: Self-pay

## 2021-08-28 DIAGNOSIS — D509 Iron deficiency anemia, unspecified: Secondary | ICD-10-CM

## 2021-08-29 ENCOUNTER — Inpatient Hospital Stay: Payer: PPO | Attending: Oncology

## 2021-08-29 DIAGNOSIS — D509 Iron deficiency anemia, unspecified: Secondary | ICD-10-CM | POA: Insufficient documentation

## 2021-08-29 LAB — CBC WITH DIFFERENTIAL/PLATELET
Abs Immature Granulocytes: 0.06 10*3/uL (ref 0.00–0.07)
Basophils Absolute: 0.1 10*3/uL (ref 0.0–0.1)
Basophils Relative: 1 %
Eosinophils Absolute: 0.1 10*3/uL (ref 0.0–0.5)
Eosinophils Relative: 2 %
HCT: 38.9 % (ref 36.0–46.0)
Hemoglobin: 12.8 g/dL (ref 12.0–15.0)
Immature Granulocytes: 1 %
Lymphocytes Relative: 32 %
Lymphs Abs: 2.6 10*3/uL (ref 0.7–4.0)
MCH: 29.7 pg (ref 26.0–34.0)
MCHC: 32.9 g/dL (ref 30.0–36.0)
MCV: 90.3 fL (ref 80.0–100.0)
Monocytes Absolute: 0.7 10*3/uL (ref 0.1–1.0)
Monocytes Relative: 9 %
Neutro Abs: 4.7 10*3/uL (ref 1.7–7.7)
Neutrophils Relative %: 55 %
Platelets: 302 10*3/uL (ref 150–400)
RBC: 4.31 MIL/uL (ref 3.87–5.11)
RDW: 13.2 % (ref 11.5–15.5)
WBC: 8.2 10*3/uL (ref 4.0–10.5)
nRBC: 0 % (ref 0.0–0.2)

## 2021-08-29 LAB — COMPREHENSIVE METABOLIC PANEL
ALT: 18 U/L (ref 0–44)
AST: 25 U/L (ref 15–41)
Albumin: 3.9 g/dL (ref 3.5–5.0)
Alkaline Phosphatase: 74 U/L (ref 38–126)
Anion gap: 7 (ref 5–15)
BUN: 18 mg/dL (ref 8–23)
CO2: 28 mmol/L (ref 22–32)
Calcium: 9.1 mg/dL (ref 8.9–10.3)
Chloride: 101 mmol/L (ref 98–111)
Creatinine, Ser: 0.84 mg/dL (ref 0.44–1.00)
GFR, Estimated: >60 mL/min (ref >60)
Glucose, Bld: 118 mg/dL — ABNORMAL HIGH (ref 70–99)
Potassium: 4.7 mmol/L (ref 3.5–5.1)
Sodium: 136 mmol/L (ref 135–145)
Total Bilirubin: 0.4 mg/dL (ref 0.3–1.2)
Total Protein: 6.9 g/dL (ref 6.5–8.1)

## 2021-08-29 LAB — IRON AND TIBC
Iron: 77 ug/dL (ref 28–170)
Saturation Ratios: 26 % (ref 10.4–31.8)
TIBC: 295 ug/dL (ref 250–450)
UIBC: 218 ug/dL

## 2021-08-29 LAB — FERRITIN: Ferritin: 160 ng/mL (ref 11–307)

## 2021-08-30 NOTE — Progress Notes (Unsigned)
[place]  Telephone:([phone] [phone]) [phone]  ID: Katherine Bradshaw OB: [birth year]  MR#: [id number]  JSE#:[id number]  Patient Care Team: Katherine Aus, MD as PCP - General (Internal Medicine)  CHIEF COMPLAINT: Leukocytosis, iron deficiency anemia.  INTERVAL HISTORY: Patient returns to clinic today for repeat laboratory work and hospital follow-up.  She feels significantly improved since discharge and nearly back to her baseline.  She does not complain of any shortness of breath or weakness and fatigue today. She has no neurologic complaints.  She denies any fevers.  She has a fair appetite, but denies weight loss.  She denies any chest pain, shortness of breath, cough, or hemoptysis.  She denies any nausea, vomiting, constipation, or diarrhea. She has no urinary complaints.  Patient offers no specific complaints today.  REVIEW OF SYSTEMS:   Review of Systems  Constitutional: Negative.  Negative for fever, malaise/fatigue and weight loss.  Respiratory: Negative.  Negative for cough, hemoptysis and shortness of breath.   Cardiovascular: Negative.  Negative for chest pain and leg swelling.  Gastrointestinal: Negative.  Negative for abdominal pain.  Genitourinary: Negative.  Negative for dysuria.  Musculoskeletal: Negative.  Negative for back pain.  Skin: Negative.  Negative for rash.  Neurological: Negative.  Negative for dizziness, focal weakness, weakness and headaches.  Psychiatric/Behavioral: Negative.  The patient is not nervous/anxious.     As per HPI. Otherwise, a complete review of systems is negative.  PAST MEDICAL HISTORY: Past Medical History:  Diagnosis Date   Acid reflux    Cancer (HCC)    skin   Heart palpitations    Thyroid disease 2019    PAST SURGICAL HISTORY: Past Surgical History:  Procedure Laterality Date   BREAST BIOPSY Bilateral    neg   colonoscopy     CYSTECTOMY  1990   cysts removal on side   VAGINAL HYSTERECTOMY  1989     FAMILY HISTORY: Family History  Problem Relation Age of Onset   Alzheimer's disease Mother    Stroke Father    Diabetes Father    Diabetes Sister    Breast cancer Sister 50   Diabetes Brother     ADVANCED DIRECTIVES (Y/N):  N  HEALTH MAINTENANCE: Social History   Tobacco Use   Smoking status: Never   Smokeless tobacco: Never  Substance Use Topics   Alcohol use: Not Currently   Drug use: Never     Colonoscopy:  PAP:  Bone density:  Lipid panel:  No Known Allergies  Current Outpatient Medications  Medication Sig Dispense Refill   apixaban (ELIQUIS) 2.5 MG TABS tablet Take 1 tablet (2.5 mg total) by mouth 2 (two) times daily. 60 tablet 0   diltiazem (CARDIZEM SR) 120 MG 12 hr capsule Take 1 capsule (120 mg total) by mouth every 12 (twelve) hours. 60 capsule 0   iron polysaccharides (NIFEREX) 150 MG capsule Take 1 capsule (150 mg total) by mouth daily. 30 capsule 0   latanoprost (XALATAN) 0.005 % ophthalmic solution Place 1 drop into both eyes at bedtime.     levothyroxine (SYNTHROID) 75 MCG tablet Take 75 mcg by mouth every morning.     loratadine (CLARITIN) 10 MG tablet Take 1 tablet (10 mg total) by mouth daily. 30 tablet 0   Multiple Vitamins-Minerals (MULTIVITAMIN WITH MINERALS) tablet Take 1 tablet by mouth daily.     pantoprazole (PROTONIX) 40 MG tablet Take 40 mg by mouth daily.     vitamin B-12 (CYANOCOBALAMIN) 1000 MCG tablet Take  1,000 mcg by mouth daily.     No current facility-administered medications for this visit.    OBJECTIVE: There were no vitals filed for this visit.    There is no height or weight on file to calculate BMI.    ECOG FS:0 - Asymptomatic  General: Well-developed, well-nourished, no acute distress. Eyes: Pink conjunctiva, anicteric sclera. HEENT: Normocephalic, moist mucous membranes. Lungs: No audible wheezing or coughing. Heart: Regular rate and rhythm. Abdomen: Soft, nontender, no obvious distention. Musculoskeletal: No  edema, cyanosis, or clubbing. Neuro: Alert, answering all questions appropriately. Cranial nerves grossly intact. Skin: No rashes or petechiae noted. Psych: Normal affect. Lymphatics: No cervical, calvicular, axillary or inguinal LAD.   LAB RESULTS:  Lab Results  Component Value Date   NA 136 08/29/2021   K 4.7 08/29/2021   CL 101 08/29/2021   CO2 28 08/29/2021   GLUCOSE 118 (H) 08/29/2021   BUN 18 08/29/2021   CREATININE 0.84 08/29/2021   CALCIUM 9.1 08/29/2021   PROT 6.9 08/29/2021   ALBUMIN 3.9 08/29/2021   AST 25 08/29/2021   ALT 18 08/29/2021   ALKPHOS 74 08/29/2021   BILITOT 0.4 08/29/2021   GFRNONAA >60 08/29/2021    Lab Results  Component Value Date   WBC 8.2 08/29/2021   NEUTROABS 4.7 08/29/2021   HGB 12.8 08/29/2021   HCT 38.9 08/29/2021   MCV 90.3 08/29/2021   PLT 302 08/29/2021   Lab Results  Component Value Date   IRON 77 08/29/2021   TIBC 295 08/29/2021   IRONPCTSAT 26 08/29/2021   Lab Results  Component Value Date   FERRITIN 160 08/29/2021     STUDIES: No results found.  ASSESSMENT: Leukocytosis, iron deficiency anemia.  PLAN:    1.  Leukocytosis: Resolved.  Likely reactive.  Peripheral blood flow cytometry and BCR-ABL mutation are negative.  No further interventions are needed.   2.  Iron deficiency anemia.  Patient's hemoglobin and iron stores remain decreased.  Return to clinic 5 times over the next several weeks to receive 200 mg IV Venofer.  Patient will then return to clinic in 4 months for repeat laboratory work, further evaluation, and continuation of treatment if needed.   3.  Atrial fibrillation: Continue follow-up and treatment per cardiology.    I spent a total of 30 minutes reviewing chart data, face-to-face evaluation with the patient, counseling and coordination of care as detailed above.  Patient expressed understanding and was in agreement with this plan. She also understands that She can call clinic at any time with any  questions, concerns, or complaints.    Katherine Huger, MD   08/30/2021 4:59 PM

## 2021-09-02 MED FILL — Iron Sucrose Inj 20 MG/ML (Fe Equiv): INTRAVENOUS | Qty: 10 | Status: AC

## 2021-09-03 ENCOUNTER — Encounter: Payer: Self-pay | Admitting: Oncology

## 2021-09-03 ENCOUNTER — Inpatient Hospital Stay: Payer: PPO

## 2021-09-03 ENCOUNTER — Inpatient Hospital Stay: Payer: PPO | Attending: Oncology | Admitting: Oncology

## 2021-09-03 VITALS — BP 137/49 | HR 66 | Temp 96.5°F | Resp 16 | Ht 65.0 in | Wt 123.8 lb

## 2021-09-03 DIAGNOSIS — Z79899 Other long term (current) drug therapy: Secondary | ICD-10-CM | POA: Diagnosis not present

## 2021-09-03 DIAGNOSIS — D72829 Elevated white blood cell count, unspecified: Secondary | ICD-10-CM | POA: Insufficient documentation

## 2021-09-03 DIAGNOSIS — I4891 Unspecified atrial fibrillation: Secondary | ICD-10-CM | POA: Insufficient documentation

## 2021-09-03 DIAGNOSIS — Z7901 Long term (current) use of anticoagulants: Secondary | ICD-10-CM | POA: Insufficient documentation

## 2021-09-03 DIAGNOSIS — D509 Iron deficiency anemia, unspecified: Secondary | ICD-10-CM | POA: Diagnosis not present

## 2021-09-04 ENCOUNTER — Encounter: Payer: Self-pay | Admitting: Oncology

## 2021-09-04 NOTE — Addendum Note (Signed)
Addended by: Delice Bison E on: 09/04/2021 02:49 PM   Modules accepted: Orders

## 2021-10-09 DIAGNOSIS — I480 Paroxysmal atrial fibrillation: Secondary | ICD-10-CM | POA: Diagnosis not present

## 2021-10-09 DIAGNOSIS — M5010 Cervical disc disorder with radiculopathy, unspecified cervical region: Secondary | ICD-10-CM | POA: Diagnosis not present

## 2021-11-20 DIAGNOSIS — E039 Hypothyroidism, unspecified: Secondary | ICD-10-CM | POA: Diagnosis not present

## 2021-11-20 DIAGNOSIS — R0781 Pleurodynia: Secondary | ICD-10-CM | POA: Diagnosis not present

## 2021-11-27 DIAGNOSIS — I480 Paroxysmal atrial fibrillation: Secondary | ICD-10-CM | POA: Diagnosis not present

## 2021-11-27 DIAGNOSIS — D500 Iron deficiency anemia secondary to blood loss (chronic): Secondary | ICD-10-CM | POA: Diagnosis not present

## 2021-11-27 DIAGNOSIS — E039 Hypothyroidism, unspecified: Secondary | ICD-10-CM | POA: Diagnosis not present

## 2021-11-27 DIAGNOSIS — E782 Mixed hyperlipidemia: Secondary | ICD-10-CM | POA: Diagnosis not present

## 2021-12-11 DIAGNOSIS — I480 Paroxysmal atrial fibrillation: Secondary | ICD-10-CM | POA: Diagnosis not present

## 2022-01-03 DIAGNOSIS — H401111 Primary open-angle glaucoma, right eye, mild stage: Secondary | ICD-10-CM | POA: Diagnosis not present

## 2022-02-21 DIAGNOSIS — R0781 Pleurodynia: Secondary | ICD-10-CM | POA: Diagnosis not present

## 2022-02-21 DIAGNOSIS — J439 Emphysema, unspecified: Secondary | ICD-10-CM | POA: Diagnosis not present

## 2022-02-21 DIAGNOSIS — I480 Paroxysmal atrial fibrillation: Secondary | ICD-10-CM | POA: Diagnosis not present

## 2022-02-21 DIAGNOSIS — K2100 Gastro-esophageal reflux disease with esophagitis, without bleeding: Secondary | ICD-10-CM | POA: Diagnosis not present

## 2022-03-04 ENCOUNTER — Inpatient Hospital Stay: Payer: PPO

## 2022-03-06 ENCOUNTER — Ambulatory Visit: Payer: PPO

## 2022-03-06 ENCOUNTER — Ambulatory Visit: Payer: PPO | Admitting: Oncology

## 2022-05-20 ENCOUNTER — Emergency Department: Payer: PPO

## 2022-05-20 ENCOUNTER — Telehealth: Payer: Self-pay

## 2022-05-20 ENCOUNTER — Other Ambulatory Visit: Payer: Self-pay

## 2022-05-20 ENCOUNTER — Emergency Department
Admission: EM | Admit: 2022-05-20 | Discharge: 2022-05-20 | Disposition: A | Discharge status: Home / Self Care | Payer: PPO | Attending: Emergency Medicine | Admitting: Emergency Medicine

## 2022-05-20 DIAGNOSIS — R9431 Abnormal electrocardiogram [ECG] [EKG]: Secondary | ICD-10-CM | POA: Diagnosis not present

## 2022-05-20 DIAGNOSIS — W010XXA Fall on same level from slipping, tripping and stumbling without subsequent striking against object, initial encounter: Secondary | ICD-10-CM | POA: Diagnosis not present

## 2022-05-20 DIAGNOSIS — Z23 Encounter for immunization: Secondary | ICD-10-CM | POA: Diagnosis not present

## 2022-05-20 DIAGNOSIS — S0993XA Unspecified injury of face, initial encounter: Secondary | ICD-10-CM | POA: Diagnosis present

## 2022-05-20 DIAGNOSIS — S0083XA Contusion of other part of head, initial encounter: Secondary | ICD-10-CM

## 2022-05-20 DIAGNOSIS — I480 Paroxysmal atrial fibrillation: Secondary | ICD-10-CM | POA: Insufficient documentation

## 2022-05-20 DIAGNOSIS — I6200 Nontraumatic subdural hemorrhage, unspecified: Secondary | ICD-10-CM | POA: Diagnosis not present

## 2022-05-20 DIAGNOSIS — W19XXXA Unspecified fall, initial encounter: Secondary | ICD-10-CM

## 2022-05-20 DIAGNOSIS — E039 Hypothyroidism, unspecified: Secondary | ICD-10-CM | POA: Diagnosis not present

## 2022-05-20 DIAGNOSIS — Z043 Encounter for examination and observation following other accident: Secondary | ICD-10-CM | POA: Diagnosis not present

## 2022-05-20 DIAGNOSIS — S065X0A Traumatic subdural hemorrhage without loss of consciousness, initial encounter: Secondary | ICD-10-CM | POA: Diagnosis not present

## 2022-05-20 DIAGNOSIS — M25561 Pain in right knee: Secondary | ICD-10-CM | POA: Diagnosis not present

## 2022-05-20 LAB — CBC
HCT: 37.6 % (ref 36.0–46.0)
Hemoglobin: 12.4 g/dL (ref 12.0–15.0)
MCH: 30 pg (ref 26.0–34.0)
MCHC: 33 g/dL (ref 30.0–36.0)
MCV: 91 fL (ref 80.0–100.0)
Platelets: 326 10*3/uL (ref 150–400)
RBC: 4.13 MIL/uL (ref 3.87–5.11)
RDW: 12.5 % (ref 11.5–15.5)
WBC: 9.6 10*3/uL (ref 4.0–10.5)
nRBC: 0 % (ref 0.0–0.2)

## 2022-05-20 LAB — BASIC METABOLIC PANEL
Anion gap: 11 (ref 5–15)
BUN: 15 mg/dL (ref 8–23)
CO2: 24 mmol/L (ref 22–32)
Calcium: 9 mg/dL (ref 8.9–10.3)
Chloride: 101 mmol/L (ref 98–111)
Creatinine, Ser: 0.74 mg/dL (ref 0.44–1.00)
GFR, Estimated: >60 mL/min (ref >60)
Glucose, Bld: 90 mg/dL (ref 70–99)
Potassium: 3.6 mmol/L (ref 3.5–5.1)
Sodium: 136 mmol/L (ref 135–145)

## 2022-05-20 MED ORDER — TETANUS-DIPHTH-ACELL PERTUSSIS 5-2.5-18.5 LF-MCG/0.5 IM SUSY
0.5000 mL | PREFILLED_SYRINGE | Freq: Once | INTRAMUSCULAR | Status: AC
  Administered 2022-05-20: 0.5 mL via INTRAMUSCULAR
  Filled 2022-05-20: qty 0.5

## 2022-05-20 NOTE — Telephone Encounter (Signed)
The ER contacted Dr Myer Haff about this patient's subdural hemorrhage. Dr Myer Haff said he would like her to have a repeat head CT in 1 month and a telephone call after. CT has been ordered (to be done around 06/19/22).

## 2022-05-20 NOTE — ED Triage Notes (Signed)
Pt to ED for fall today, tripped over dogs leash and it wrapped around her legs. Fell on gravel, reports hitting head. Bruising noted to right eye, right chin. Swelling to lower lip. Swelling to right knee.  States +LOC. No blood thinner use.  Alert and oriented.

## 2022-05-20 NOTE — ED Notes (Signed)
Pt A&O x4, no obvious distress noted, respirations regular/unlabored. Pt verbalizes understanding of discharge instructions. Pt able to ambulate from ED independently.   

## 2022-05-20 NOTE — Discharge Instructions (Addendum)
Dr. Lucienne Capers office should contact you within the next several days for a follow-up appointment.  Return to the ER for new, worsening, or persistent severe headache, dizziness, vomiting, weakness, bleeding, or any other new or worsening symptoms that concern you.

## 2022-05-20 NOTE — ED Provider Notes (Signed)
[facility] Provider Note    Event Date/Time   First MD Initiated Contact with Patient 05/20/22 1520     (approximate)   History   Fall   HPI  Katherine Bradshaw is a 82 y.o. female with a history of hypothyroidism GERD, and paroxysmal atrial fibrillation who presents with head injury after a fall from standing height.  The patient states that she tripped around her dog's leash and fell forward hitting her face on the gravel.  She believes that she briefly lost consciousness.  She reports a mild headache at this time but denies any nausea or vomiting.  She also has pain to her right knee.  She denies any other injuries.  I reviewed the past medical records.  The patient was most recently admitted last year for parapneumonic effusion.  She has no recent ED visits or admissions.  At that time she was on Eliquis but this has since been discontinued, and the patient confirms this.   Physical Exam   Triage Vital Signs: ED Triage Vitals  Enc Vitals Group     BP 05/20/22 1409 (!) 120/58     Pulse Rate 05/20/22 1409 65     Resp 05/20/22 1409 18     Temp 05/20/22 1409 97.9 F (36.6 C)     Temp src --      SpO2 05/20/22 1409 97 %     Weight 05/20/22 1410 123 lb (55.8 kg)     Height 05/20/22 1410  (1.651 m)     Head Circumference --      Peak Flow --      Pain Score 05/20/22 1409 5     Pain Loc --      Pain Edu? --      Excl. in GC? --     Most recent vital signs: Vitals:   05/20/22 1409 05/20/22 1600  BP: (!) 120/58 139/74  Pulse: 65 (!) 58  Resp: 18 18  Temp: 97.9 F (36.6 C)   SpO2: 97% 96%     General: Alert and oriented, no distress.  Facial bruising, no deformity. CV:  Good peripheral perfusion.  Resp:  Normal effort.  Abd:  No distention.  Other:  EOMI.  PERRLA.  No photophobia.  No facial droop.  Motor and sensory intact in all extremities.  No ataxia.  No midline cervical spinal tenderness.   ED Results / Procedures / Treatments    Labs (all labs ordered are listed, but only abnormal results are displayed) Labs Reviewed  CBC  BASIC METABOLIC PANEL     EKG  ED ECG REPORT I, [name], the attending physician, personally viewed and interpreted this ECG.  Date: 05/20/2022 EKG Time: 1415 Rate: 64 Rhythm: normal sinus rhythm QRS Axis: normal Intervals: RBBB ST/T Wave abnormalities: normal Narrative Interpretation: no evidence of acute ischemia    RADIOLOGY  CT head: I independently viewed and interpreted the images; there is a trace 2 mm left sided subdural hemorrhage  CT cervical spine: No acute fracture  XR R knee: No acute fracture  PROCEDURES:  Critical Care performed: No  Procedures   MEDICATIONS ORDERED IN ED: Medications  Tdap (BOOSTRIX) injection 0.5 mL (has no administration in time range)     IMPRESSION / MDM / ASSESSMENT AND PLAN / ED COURSE  I reviewed the triage vital signs and the nursing notes.  82 year old female with PMH as noted above presents with head injury after a fall from standing  height with brief LOC.  Currently her vital signs and mental status are normal and neurologic exam is nonfocal.  Differential diagnosis includes, but is not limited to, intracranial hemorrhage, concussion, minor head injury.  BMP and CBC are within normal limits.  CT head shows a trace left-sided subdural hemorrhage.  CT cervical spine and x-rays of the knee are negative.  I consulted Dr. Myer Haff from neurosurgery who reviewed the images.  He recommends 6-hour observation and repeat CT.  Patient's presentation is most consistent with acute presentation with potential threat to life or bodily function.  The patient is on the cardiac monitor to evaluate for evidence of arrhythmia and/or significant heart rate changes.  ----------------------------------------- 9:26 PM on 05/20/2022 -----------------------------------------  Repeat CT after 6 hours is unchanged.  On  reassessment the patient continues to feel relatively well.  She has no new or worsening headache or any other acute symptoms.  She is stable for discharge at this time.  Dr. Myer Haff advised that he would be arranging for outpatient follow-up.  I counseled the patient on the results of the workup and plan of care.  I gave her strict return precautions and she expressed understanding.  I have also updated her tetanus as she has not had a booster in the last 10 years.   FINAL CLINICAL IMPRESSION(S) / ED DIAGNOSES   Final diagnoses:  Fall, initial encounter  Contusion of face, initial encounter  Subdural hemorrhage     Rx / DC Orders   ED Discharge Orders     None        Note:  This document was prepared using Dragon voice recognition software and may include unintentional dictation errors.    [name], MD 05/20/22 2126

## 2022-05-21 NOTE — Telephone Encounter (Signed)
Message has been sent to [facility] scheduling.  

## 2022-05-21 NOTE — Telephone Encounter (Signed)
Patient is aware of Dr.Yarbrough's plan of care. I will call her back once I see her CT has been scheduled.

## 2022-05-23 ENCOUNTER — Telehealth: Payer: Self-pay

## 2022-05-23 NOTE — Telephone Encounter (Signed)
        Patient  visited [facility] on 05/20/2022  for fall.   Telephone encounter attempt :  1st  A HIPAA compliant voice message was left requesting a return call.  Instructed patient to call back at [phone].   [facility]  [facility] Population Health Community Resource Care Guide   [email or web address]  ?? [phone]   Website: [email or web address]  [email or web address]

## 2022-05-25 ENCOUNTER — Emergency Department: Payer: PPO

## 2022-05-25 ENCOUNTER — Emergency Department
Admission: EM | Admit: 2022-05-25 | Discharge: 2022-05-25 | Disposition: A | Discharge status: Home / Self Care | Payer: PPO | Attending: Emergency Medicine | Admitting: Emergency Medicine

## 2022-05-25 ENCOUNTER — Other Ambulatory Visit: Payer: Self-pay

## 2022-05-25 ENCOUNTER — Encounter: Payer: Self-pay | Admitting: Intensive Care

## 2022-05-25 ENCOUNTER — Ambulatory Visit
Admission: EM | Admit: 2022-05-25 | Discharge: 2022-05-25 | Disposition: A | Discharge status: Home / Self Care | Payer: PPO | Attending: Emergency Medicine | Admitting: Emergency Medicine

## 2022-05-25 ENCOUNTER — Ambulatory Visit (INDEPENDENT_AMBULATORY_CARE_PROVIDER_SITE_OTHER): Payer: PPO

## 2022-05-25 DIAGNOSIS — M25439 Effusion, unspecified wrist: Secondary | ICD-10-CM | POA: Diagnosis present

## 2022-05-25 DIAGNOSIS — E039 Hypothyroidism, unspecified: Secondary | ICD-10-CM | POA: Diagnosis not present

## 2022-05-25 DIAGNOSIS — W19XXXD Unspecified fall, subsequent encounter: Secondary | ICD-10-CM | POA: Diagnosis not present

## 2022-05-25 DIAGNOSIS — M25532 Pain in left wrist: Secondary | ICD-10-CM

## 2022-05-25 DIAGNOSIS — M7989 Other specified soft tissue disorders: Secondary | ICD-10-CM | POA: Diagnosis not present

## 2022-05-25 DIAGNOSIS — S6982XA Other specified injuries of left wrist, hand and finger(s), initial encounter: Secondary | ICD-10-CM | POA: Diagnosis not present

## 2022-05-25 DIAGNOSIS — M25432 Effusion, left wrist: Secondary | ICD-10-CM | POA: Diagnosis not present

## 2022-05-25 DIAGNOSIS — I509 Heart failure, unspecified: Secondary | ICD-10-CM | POA: Insufficient documentation

## 2022-05-25 LAB — COMPREHENSIVE METABOLIC PANEL
ALT: 16 U/L (ref 0–44)
AST: 20 U/L (ref 15–41)
Albumin: 4 g/dL (ref 3.5–5.0)
Alkaline Phosphatase: 84 U/L (ref 38–126)
Anion gap: 8 (ref 5–15)
BUN: 16 mg/dL (ref 8–23)
CO2: 26 mmol/L (ref 22–32)
Calcium: 8.9 mg/dL (ref 8.9–10.3)
Chloride: 99 mmol/L (ref 98–111)
Creatinine, Ser: 0.74 mg/dL (ref 0.44–1.00)
GFR, Estimated: >60 mL/min (ref >60)
Glucose, Bld: 109 mg/dL — ABNORMAL HIGH (ref 70–99)
Potassium: 3.9 mmol/L (ref 3.5–5.1)
Sodium: 133 mmol/L — ABNORMAL LOW (ref 135–145)
Total Bilirubin: 0.7 mg/dL (ref 0.3–1.2)
Total Protein: 7.2 g/dL (ref 6.5–8.1)

## 2022-05-25 LAB — CBC WITH DIFFERENTIAL/PLATELET
Abs Immature Granulocytes: 0.04 10*3/uL (ref 0.00–0.07)
Basophils Absolute: 0 10*3/uL (ref 0.0–0.1)
Basophils Relative: 0 %
Eosinophils Absolute: 0.1 10*3/uL (ref 0.0–0.5)
Eosinophils Relative: 1 %
HCT: 38.1 % (ref 36.0–46.0)
Hemoglobin: 12.3 g/dL (ref 12.0–15.0)
Immature Granulocytes: 0 %
Lymphocytes Relative: 22 %
Lymphs Abs: 2.4 10*3/uL (ref 0.7–4.0)
MCH: 29.3 pg (ref 26.0–34.0)
MCHC: 32.3 g/dL (ref 30.0–36.0)
MCV: 90.7 fL (ref 80.0–100.0)
Monocytes Absolute: 1.1 10*3/uL — ABNORMAL HIGH (ref 0.1–1.0)
Monocytes Relative: 10 %
Neutro Abs: 7.2 10*3/uL (ref 1.7–7.7)
Neutrophils Relative %: 67 %
Platelets: 323 10*3/uL (ref 150–400)
RBC: 4.2 MIL/uL (ref 3.87–5.11)
RDW: 12.3 % (ref 11.5–15.5)
WBC: 10.8 10*3/uL — ABNORMAL HIGH (ref 4.0–10.5)
nRBC: 0 % (ref 0.0–0.2)

## 2022-05-25 MED ORDER — ACETAMINOPHEN 325 MG PO TABS
650.0000 mg | ORAL_TABLET | Freq: Once | ORAL | Status: AC
  Administered 2022-05-25: 650 mg via ORAL

## 2022-05-25 MED ORDER — OXYCODONE-ACETAMINOPHEN 5-325 MG PO TABS: 1.0000 | ORAL_TABLET | ORAL | 0 refills | Status: AC | PRN

## 2022-05-25 MED ORDER — OXYCODONE-ACETAMINOPHEN 5-325 MG PO TABS
1.0000 | ORAL_TABLET | Freq: Once | ORAL | Status: AC
  Administered 2022-05-25: 1 via ORAL
  Filled 2022-05-25: qty 1

## 2022-05-25 NOTE — Discharge Instructions (Addendum)
The xray does show a fracture.    Go to the emergency department if you have worsening symptoms.    Follow up with your primary care provider or an orthopedist.

## 2022-05-25 NOTE — ED Notes (Signed)
Pillow and ice pack provided.

## 2022-05-25 NOTE — ED Notes (Signed)
Pt in US

## 2022-05-25 NOTE — ED Triage Notes (Signed)
Patient is having swelling in left wrist that started 3-4 days ago. UC sent patient here for possible blood clot in wrist. Progressively getting worse each day. Had a fall on 05/20/22 and seen in ER at [facility].   Bruising noted to face from fall on 05/20/22

## 2022-05-25 NOTE — ED Provider Notes (Signed)
Renaldo Fiddler    CSN: [id number] Arrival date & time: 05/25/22  0907      History   Chief Complaint Chief Complaint  Patient presents with   Wrist Pain    HPI Katherine Bradshaw is a 82 y.o. female.  Accompanied by her son, patient presents with left wrist pain, swelling, and bruising x 4 days.  She had a fall on 05/20/2022; she was seen at [facility] ED at that time; diagnosed with subdural hemorrhage; she was discharged with instructions to follow-up outpatient neurosurgeon Dr. Myer Haff.  Her wrist pain did not start that day but 2 days later.  She reports numbness in her fingers and the swelling is worse today.  Treating with ice pack.  Her medical history includes atrial fibrillation, heart failure, hypothyroidism.     The history is provided by a relative, medical records and the patient.    Past Medical History:  Diagnosis Date   Acid reflux    Cancer    skin   Heart palpitations    Thyroid disease 2019    Patient Active Problem List   Diagnosis Date Noted   Wrist pain, acute, left 05/25/2022   Iron deficiency anemia 04/13/2021   Reactive thrombocytosis 04/12/2021   Acute on chronic diastolic CHF (congestive heart failure) 04/12/2021   Parapneumonic effusion 04/11/2021   Hyponatremia 04/11/2021   Paroxysmal atrial fibrillation 04/11/2021   Leukemoid reaction 04/11/2021   Severe sepsis 04/11/2021   Polyneuralgia due to herpes zoster 02/15/2021   Medicare annual wellness visit, initial 05/07/2016   Acquired hypothyroidism 04/25/2015   Adult idiopathic generalized osteoporosis 04/24/2014   Hyperlipidemia, mixed 04/06/2014    Past Surgical History:  Procedure Laterality Date   BREAST BIOPSY Bilateral    neg   colonoscopy     CYSTECTOMY  1990   cysts removal on side   VAGINAL HYSTERECTOMY  1989    OB History   No obstetric history on file.      Home Medications    Prior to Admission medications   Medication Sig Start Date End Date Taking?  Authorizing Provider  apixaban (ELIQUIS) 2.5 MG TABS tablet Take 1 tablet (2.5 mg total) by mouth 2 (two) times daily. Patient not taking: Reported on 09/03/2021 04/19/21   Alford Highland, MD  Calcium Carbonate-Vit D-Min (CALCIUM 1200 PO) Take by mouth.    [provider]  diltiazem (CARDIZEM SR) 120 MG 12 hr capsule Take 1 capsule (120 mg total) by mouth every 12 (twelve) hours. 04/19/21   Alford Highland, MD  iron polysaccharides (NIFEREX) 150 MG capsule Take 1 capsule (150 mg total) by mouth daily. Patient not taking: Reported on 09/03/2021 04/20/21   Alford Highland, MD  latanoprost (XALATAN) 0.005 % ophthalmic solution Place 1 drop into both eyes at bedtime.    [provider]  levothyroxine (SYNTHROID) 75 MCG tablet Take 75 mcg by mouth every morning.    [provider]  loratadine (CLARITIN) 10 MG tablet Take 1 tablet (10 mg total) by mouth daily. Patient not taking: Reported on 09/03/2021 04/20/21   Alford Highland, MD  Multiple Vitamins-Minerals (MULTIVITAMIN WITH MINERALS) tablet Take 1 tablet by mouth daily.    [provider]  pantoprazole (PROTONIX) 40 MG tablet Take 40 mg by mouth daily.    [provider]  vitamin B-12 (CYANOCOBALAMIN) 1000 MCG tablet Take 1,000 mcg by mouth daily.    [provider]    Family History Family History  Problem Relation Age of Onset  Alzheimer's disease Mother    Stroke Father    Diabetes Father    Diabetes Sister    Breast cancer Sister 44   Diabetes Brother     Social History Social History   Tobacco Use   Smoking status: Never   Smokeless tobacco: Never  Substance Use Topics   Alcohol use: Not Currently   Drug use: Never     Allergies   Patient has no known allergies.   Review of Systems Review of Systems  Constitutional:  Negative for chills and fever.  Respiratory:  Negative for cough and shortness of breath.   Cardiovascular:  Negative for chest pain and palpitations.   Musculoskeletal:  Positive for arthralgias and joint swelling.  Skin:  Positive for color change. Negative for wound.  Neurological:  Positive for numbness. Negative for weakness.  All other systems reviewed and are negative.    Physical Exam Triage Vital Signs ED Triage Vitals  Enc Vitals Group     BP 05/25/22 1057 (!) 110/92     Pulse Rate 05/25/22 1035 80     Resp 05/25/22 1035 18     Temp 05/25/22 1035 97.8 F (36.6 C)     Temp src --      SpO2 05/25/22 1035 95 %     Weight --      Height --      Head Circumference --      Peak Flow --      Pain Score 05/25/22 1048 10     Pain Loc --      Pain Edu? --      Excl. in GC? --    No data found.  Updated Vital Signs BP (!) 110/92   Pulse 80   Temp 97.8 F (36.6 C)   Resp 18   SpO2 95%   Visual Acuity Right Eye Distance:   Left Eye Distance:   Bilateral Distance:    Right Eye Near:   Left Eye Near:    Bilateral Near:     Physical Exam Constitutional:      General: She is not in acute distress.    Appearance: Normal appearance.  HENT:     Mouth/Throat:     Mouth: Mucous membranes are moist.  Cardiovascular:     Rate and Rhythm: Normal rate and regular rhythm.     Heart sounds: Normal heart sounds.  Pulmonary:     Effort: Pulmonary effort is normal. No respiratory distress.     Breath sounds: Normal breath sounds.  Musculoskeletal:        General: Swelling and tenderness present. No deformity.     Comments: Left wrist is tender and edematous with ecchymosis.  Firm tender area of ecchymosis on inner wrist with edema distal to this area.  Decreased sensation in second and third fingers.  Brisk capillary refill.  No wounds or erythema.  2+ radial pulse.  Decreased ROM of wrist and fingers due to discomfort and edema.   Skin:    General: Skin is warm and dry.     Findings: Bruising present. No erythema or lesion.  Neurological:     Mental Status: She is alert.     Sensory: Sensory deficit present.      Motor: No weakness.  Psychiatric:        Mood and Affect: Mood normal.        Behavior: Behavior normal.      UC Treatments / Results  Labs (all labs ordered are  listed, but only abnormal results are displayed) Labs Reviewed - No data to display  EKG   Radiology DG Wrist Complete Left  Result Date: 05/25/2022 CLINICAL DATA:  Bruising/ pain following injury EXAM: LEFT WRIST - COMPLETE 3+ VIEW COMPARISON:  None Available. FINDINGS: Bones are demineralized. No evidence of acute fracture. No malalignment. Severe osteoarthritic changes of the first CMC and triscaphe joints. Mild radiocarpal joint space narrowing. Chondrocalcinosis of the TFCC. Soft tissue swelling of the distal forearm and wrist. IMPRESSION: 1. No acute fracture or malalignment of the left wrist. 2. Severe osteoarthritic changes of the first CMC and triscaphe joints. Electronically Signed   By: Duanne Guess D.O.   On: 05/25/2022 10:57    Procedures Procedures (including critical care time)  Medications Ordered in UC Medications  acetaminophen (TYLENOL) tablet 650 mg (650 mg Oral Given 05/25/22 1057)    Initial Impression / Assessment and Plan / UC Course  I have reviewed the triage vital signs and the nursing notes.  Pertinent labs & imaging results that were available during my care of the patient were reviewed by me and considered in my medical decision making (see chart for details).   Pain and swelling of left wrist.  X-ray does not show any acute bony abnormality.  Patient has a area on her inner wrist that is firm and tender and ecchymotic.  Patient recently had a fall and had a subdural hemorrhage.  She has history of atrial fibrillation but is no longer on anticoagulant.  ED precautions discussed.  Instructed patient and her son to follow-up with her PCP or an orthopedist tomorrow.  They agree to plan of care.   Final Clinical Impressions(s) / UC Diagnoses   Final diagnoses:  Wrist pain, acute, left   Swelling of left wrist     Discharge Instructions      The xray does show a fracture.    Go to the emergency department if you have worsening symptoms.    Follow up with your primary care provider or an orthopedist.        ED Prescriptions   None    PDMP not reviewed this encounter.   Mickie Bail, NP 05/25/22 1130

## 2022-05-25 NOTE — ED Notes (Signed)
Pt gone to ultrasound

## 2022-05-25 NOTE — ED Provider Notes (Signed)
[facility] Provider Note    Event Date/Time   First MD Initiated Contact with Patient 05/25/22 1207     (approximate)   History   Chief Complaint Joint Swelling   HPI  Katherine Bradshaw is a 82 y.o. female with past medical history of atrial fibrillation, hypothyroidism, CHF, and iron deficiency anemia who presents to the ED complaining of joint swelling.  Patient reports that she had a fall 5 days ago, was seen in the ED at that time and diagnosed with small SDH.  Follow-up CT imaging showed stable SDH and patient was discharged home.  She states she was not initially having pain in her wrist but over the past 2 days has had increasing pain and swelling in the wrist.  She denies any fevers, redness, or warmth to the wrist.  She states she is able to bend the wrist but it is painful to do so.  She denies significant issues with the left wrist in the past, does not take a blood thinner.  She states she has otherwise been doing well with improving bruising and swelling around her face, denies other joint problems.      Physical Exam   Triage Vital Signs: ED Triage Vitals [05/25/22 1141]  Enc Vitals Group     BP (!) 145/93     Pulse Rate 61     Resp 16     Temp 97.7 F (36.5 C)     Temp Source Oral     SpO2 98 %     Weight 123 lb (55.8 kg)     Height  (1.651 m)     Head Circumference      Peak Flow      Pain Score 10     Pain Loc      Pain Edu?      Excl. in GC?     Most recent vital signs: Vitals:   05/25/22 1141  BP: (!) 145/93  Pulse: 61  Resp: 16  Temp: 97.7 F (36.5 C)  SpO2: 98%    Constitutional: Alert and oriented. Eyes: Conjunctivae are normal. Head: Facial ecchymosis and edema, greatest at the right side of the mandible. Nose: No congestion/rhinnorhea. Mouth/Throat: Mucous membranes are moist.  Cardiovascular: Normal rate, regular rhythm. Grossly normal heart sounds.  2+ radial pulses bilaterally. Respiratory: Normal  respiratory effort.  No retractions. Lungs CTAB. Gastrointestinal: Soft and nontender. No distention. Musculoskeletal: Edema noted to left wrist, proximal hand, and distal forearm with no overlying erythema or warmth.  Diffuse tenderness of left wrist noted, patient able to range wrist with some discomfort. No lower extremity tenderness nor edema.  Neurologic:  Normal speech and language. No gross focal neurologic deficits are appreciated.    ED Results / Procedures / Treatments   Labs (all labs ordered are listed, but only abnormal results are displayed) Labs Reviewed  CBC WITH DIFFERENTIAL/PLATELET - Abnormal; Notable for the following components:      Result Value   WBC 10.8 (*)    Monocytes Absolute 1.1 (*)    All other components within normal limits  COMPREHENSIVE METABOLIC PANEL - Abnormal; Notable for the following components:   Sodium 133 (*)    Glucose, Bld 109 (*)    All other components within normal limits    RADIOLOGY Upper extremity ultrasound reviewed and interpreted by me with no evidence of DVT.  PROCEDURES:  Critical Care performed: No  Procedures   MEDICATIONS ORDERED IN ED: Medications  oxyCODONE-acetaminophen (PERCOCET/ROXICET) 5-325 MG per tablet 1 tablet (1 tablet Oral Given 05/25/22 1414)     IMPRESSION / MDM / ASSESSMENT AND PLAN / ED COURSE  I reviewed the triage vital signs and the nursing notes.                              82 y.o. female with past medical history of atrial fibrillation, hypothyroidism, CHF, and iron deficiency anemia who presents to the ED with 2 days of increasing pain and swelling to her left wrist following fall 5 days ago.  Patient's presentation is most consistent with acute presentation with potential threat to life or bodily function.  Differential diagnosis includes, but is not limited to, septic arthritis, inflammatory arthritis, osteoarthritis, fracture, dislocation, DVT, cellulitis.  Patient nontoxic-appearing  and in no acute distress, vital signs are unremarkable.  She states she is overall doing well since her fall, no new neurologic symptoms concerning for worsening SDH and her complaints are limited to pain and swelling at her left wrist.  No signs of infection on exam and she is neurovascularly intact distally.  Low suspicion for septic arthritis as she is able to range the wrist but with some discomfort.  She appears to have an effusion at the wrist, x-ray was performed at urgent care earlier today and shows significant osteoarthritic changes.  She was sent to the ED to rule out DVT, low suspicion for this but ultrasound is pending at this time.  Labs without significant anemia, leukocytosis, electrolyte abnormality, or AKI.  We will treat with dose of Percocet and reassess following ultrasound.  Ultrasound is negative for DVT, patient's pain is improved on reassessment.  We will place patient in brace for comfort and prescribe small amount of pain medication.  She was counseled to follow-up with orthopedics and to return to the ED for new or worsening symptoms, patient agrees with plan.      FINAL CLINICAL IMPRESSION(S) / ED DIAGNOSES   Final diagnoses:  Wrist effusion, left  Fall, subsequent encounter     Rx / DC Orders   ED Discharge Orders          Ordered    oxyCODONE-acetaminophen (PERCOCET) 5-325 MG tablet  Every 4 hours PRN        05/25/22 1458             Note:  This document was prepared using Dragon voice recognition software and may include unintentional dictation errors.   Chesley Noon, MD 05/25/22 1459

## 2022-05-25 NOTE — ED Triage Notes (Signed)
Patient to Urgent Care with son, complaints of left sided wrist pain. Swelling started on Wednesday, reports swelling has steadily increased. States she took a hard fall on Monday but didn't feel the wrist pain until Monday. Denies any other injury.

## 2022-05-25 NOTE — ED Notes (Signed)
ED Provider at bedside. 

## 2022-05-26 NOTE — Telephone Encounter (Signed)
CT scan 06/19/2022 Telephone visit 06/24/2022

## 2022-05-27 DIAGNOSIS — E782 Mixed hyperlipidemia: Secondary | ICD-10-CM | POA: Diagnosis not present

## 2022-05-27 DIAGNOSIS — D500 Iron deficiency anemia secondary to blood loss (chronic): Secondary | ICD-10-CM | POA: Diagnosis not present

## 2022-05-30 ENCOUNTER — Telehealth: Payer: Self-pay

## 2022-05-30 NOTE — Telephone Encounter (Signed)
     Patient  visit on 4/21  at [place]   Have you been able to follow up with your primary care physician? Yes   The patient was or was not able to obtain any needed medicine or equipment. Yes   Are there diet recommendations that you are having difficulty following? Na   Patient expresses understanding of discharge instructions and education provided has no other needs at this time.  Yes      Lenard Forth [facility] Guide, [facility] [phone] [address] [place], [place], [place] [address] Phone: [phone] Email: [email or web address]

## 2022-05-30 NOTE — Telephone Encounter (Signed)
        Patient  visited [place] on 4/21     Telephone encounter attempt :  1st  A HIPAA compliant voice message was left requesting a return call.  Instructed patient to call back.    Lenard Forth [facility] Guide, [facility] [phone] [address] [place], [place], [place] [address] Phone: [phone] Email: [email or web address]

## 2022-06-03 DIAGNOSIS — G5602 Carpal tunnel syndrome, left upper limb: Secondary | ICD-10-CM | POA: Diagnosis not present

## 2022-06-03 DIAGNOSIS — M778 Other enthesopathies, not elsewhere classified: Secondary | ICD-10-CM | POA: Diagnosis not present

## 2022-06-04 ENCOUNTER — Other Ambulatory Visit: Payer: Self-pay | Admitting: Internal Medicine

## 2022-06-04 DIAGNOSIS — Z1231 Encounter for screening mammogram for malignant neoplasm of breast: Secondary | ICD-10-CM

## 2022-06-12 ENCOUNTER — Other Ambulatory Visit: Payer: Self-pay | Admitting: Internal Medicine

## 2022-06-12 ENCOUNTER — Ambulatory Visit
Admission: RE | Admit: 2022-06-12 | Discharge: 2022-06-12 | Disposition: A | Discharge status: Home / Self Care | Payer: PPO | Source: Ambulatory Visit | Attending: Internal Medicine | Admitting: Internal Medicine

## 2022-06-12 DIAGNOSIS — I6200 Nontraumatic subdural hemorrhage, unspecified: Secondary | ICD-10-CM

## 2022-06-12 DIAGNOSIS — I480 Paroxysmal atrial fibrillation: Secondary | ICD-10-CM | POA: Diagnosis not present

## 2022-06-12 DIAGNOSIS — G5602 Carpal tunnel syndrome, left upper limb: Secondary | ICD-10-CM | POA: Diagnosis not present

## 2022-06-12 DIAGNOSIS — Z0000 Encounter for general adult medical examination without abnormal findings: Secondary | ICD-10-CM

## 2022-06-12 DIAGNOSIS — E039 Hypothyroidism, unspecified: Secondary | ICD-10-CM | POA: Diagnosis not present

## 2022-06-12 DIAGNOSIS — M778 Other enthesopathies, not elsewhere classified: Secondary | ICD-10-CM | POA: Diagnosis not present

## 2022-06-12 DIAGNOSIS — A09 Infectious gastroenteritis and colitis, unspecified: Secondary | ICD-10-CM | POA: Diagnosis not present

## 2022-06-12 DIAGNOSIS — E782 Mixed hyperlipidemia: Secondary | ICD-10-CM | POA: Diagnosis not present

## 2022-06-12 DIAGNOSIS — S6992XA Unspecified injury of left wrist, hand and finger(s), initial encounter: Secondary | ICD-10-CM | POA: Diagnosis not present

## 2022-06-12 DIAGNOSIS — D500 Iron deficiency anemia secondary to blood loss (chronic): Secondary | ICD-10-CM | POA: Diagnosis not present

## 2022-06-19 ENCOUNTER — Ambulatory Visit: Admission: RE | Admit: 2022-06-19 | Payer: PPO | Source: Ambulatory Visit

## 2022-06-23 DIAGNOSIS — I480 Paroxysmal atrial fibrillation: Secondary | ICD-10-CM | POA: Diagnosis not present

## 2022-06-23 DIAGNOSIS — D500 Iron deficiency anemia secondary to blood loss (chronic): Secondary | ICD-10-CM | POA: Diagnosis not present

## 2022-06-23 DIAGNOSIS — E782 Mixed hyperlipidemia: Secondary | ICD-10-CM | POA: Diagnosis not present

## 2022-06-23 DIAGNOSIS — A09 Infectious gastroenteritis and colitis, unspecified: Secondary | ICD-10-CM | POA: Diagnosis not present

## 2022-06-24 ENCOUNTER — Telehealth: Payer: PPO | Admitting: Neurosurgery

## 2022-07-08 DIAGNOSIS — Z961 Presence of intraocular lens: Secondary | ICD-10-CM | POA: Diagnosis not present

## 2022-07-08 DIAGNOSIS — H401111 Primary open-angle glaucoma, right eye, mild stage: Secondary | ICD-10-CM | POA: Diagnosis not present

## 2022-07-08 DIAGNOSIS — H43813 Vitreous degeneration, bilateral: Secondary | ICD-10-CM | POA: Diagnosis not present

## 2022-07-08 DIAGNOSIS — H401122 Primary open-angle glaucoma, left eye, moderate stage: Secondary | ICD-10-CM | POA: Diagnosis not present

## 2022-07-11 ENCOUNTER — Ambulatory Visit
Admission: RE | Admit: 2022-07-11 | Discharge: 2022-07-11 | Disposition: A | Discharge status: Home / Self Care | Payer: PPO | Source: Ambulatory Visit | Attending: Internal Medicine | Admitting: Internal Medicine

## 2022-07-11 DIAGNOSIS — Z1231 Encounter for screening mammogram for malignant neoplasm of breast: Secondary | ICD-10-CM | POA: Diagnosis not present

## 2022-07-14 DIAGNOSIS — M25532 Pain in left wrist: Secondary | ICD-10-CM | POA: Diagnosis not present

## 2022-07-14 DIAGNOSIS — G5602 Carpal tunnel syndrome, left upper limb: Secondary | ICD-10-CM | POA: Diagnosis not present

## 2022-07-14 DIAGNOSIS — M778 Other enthesopathies, not elsewhere classified: Secondary | ICD-10-CM | POA: Diagnosis not present

## 2022-08-12 DIAGNOSIS — H401111 Primary open-angle glaucoma, right eye, mild stage: Secondary | ICD-10-CM | POA: Diagnosis not present

## 2022-08-12 DIAGNOSIS — H401122 Primary open-angle glaucoma, left eye, moderate stage: Secondary | ICD-10-CM | POA: Diagnosis not present

## 2022-08-20 DIAGNOSIS — R55 Syncope and collapse: Secondary | ICD-10-CM | POA: Diagnosis not present

## 2022-08-20 DIAGNOSIS — R0789 Other chest pain: Secondary | ICD-10-CM | POA: Diagnosis not present

## 2022-08-20 DIAGNOSIS — I700 Atherosclerosis of aorta: Secondary | ICD-10-CM | POA: Diagnosis not present

## 2022-09-23 DIAGNOSIS — H401122 Primary open-angle glaucoma, left eye, moderate stage: Secondary | ICD-10-CM | POA: Diagnosis not present

## 2022-09-23 DIAGNOSIS — H401111 Primary open-angle glaucoma, right eye, mild stage: Secondary | ICD-10-CM | POA: Diagnosis not present

## 2022-10-07 DIAGNOSIS — R1084 Generalized abdominal pain: Secondary | ICD-10-CM | POA: Diagnosis not present

## 2022-10-07 DIAGNOSIS — R109 Unspecified abdominal pain: Secondary | ICD-10-CM | POA: Diagnosis not present

## 2022-10-07 DIAGNOSIS — R140 Abdominal distension (gaseous): Secondary | ICD-10-CM | POA: Diagnosis not present

## 2022-10-07 DIAGNOSIS — I480 Paroxysmal atrial fibrillation: Secondary | ICD-10-CM | POA: Diagnosis not present

## 2022-11-04 DIAGNOSIS — H401122 Primary open-angle glaucoma, left eye, moderate stage: Secondary | ICD-10-CM | POA: Diagnosis not present

## 2022-11-04 DIAGNOSIS — H401111 Primary open-angle glaucoma, right eye, mild stage: Secondary | ICD-10-CM | POA: Diagnosis not present

## 2022-11-07 DIAGNOSIS — I480 Paroxysmal atrial fibrillation: Secondary | ICD-10-CM | POA: Diagnosis not present

## 2022-11-07 DIAGNOSIS — A09 Infectious gastroenteritis and colitis, unspecified: Secondary | ICD-10-CM | POA: Diagnosis not present

## 2022-11-07 DIAGNOSIS — Z23 Encounter for immunization: Secondary | ICD-10-CM | POA: Diagnosis not present

## 2022-11-07 DIAGNOSIS — K5289 Other specified noninfective gastroenteritis and colitis: Secondary | ICD-10-CM | POA: Diagnosis not present

## 2022-11-10 DIAGNOSIS — K529 Noninfective gastroenteritis and colitis, unspecified: Secondary | ICD-10-CM | POA: Diagnosis not present

## 2022-11-10 DIAGNOSIS — A09 Infectious gastroenteritis and colitis, unspecified: Secondary | ICD-10-CM | POA: Diagnosis not present

## 2022-11-21 DIAGNOSIS — K5289 Other specified noninfective gastroenteritis and colitis: Secondary | ICD-10-CM | POA: Diagnosis not present

## 2022-11-21 DIAGNOSIS — I480 Paroxysmal atrial fibrillation: Secondary | ICD-10-CM | POA: Diagnosis not present

## 2022-12-17 DIAGNOSIS — D500 Iron deficiency anemia secondary to blood loss (chronic): Secondary | ICD-10-CM | POA: Diagnosis not present

## 2022-12-17 DIAGNOSIS — E782 Mixed hyperlipidemia: Secondary | ICD-10-CM | POA: Diagnosis not present

## 2022-12-18 DIAGNOSIS — M1811 Unilateral primary osteoarthritis of first carpometacarpal joint, right hand: Secondary | ICD-10-CM | POA: Diagnosis not present

## 2022-12-24 DIAGNOSIS — M818 Other osteoporosis without current pathological fracture: Secondary | ICD-10-CM | POA: Diagnosis not present

## 2022-12-24 DIAGNOSIS — D500 Iron deficiency anemia secondary to blood loss (chronic): Secondary | ICD-10-CM | POA: Diagnosis not present

## 2022-12-24 DIAGNOSIS — E782 Mixed hyperlipidemia: Secondary | ICD-10-CM | POA: Diagnosis not present

## 2022-12-24 DIAGNOSIS — R739 Hyperglycemia, unspecified: Secondary | ICD-10-CM | POA: Diagnosis not present

## 2022-12-24 DIAGNOSIS — I480 Paroxysmal atrial fibrillation: Secondary | ICD-10-CM | POA: Diagnosis not present

## 2023-01-05 DIAGNOSIS — J40 Bronchitis, not specified as acute or chronic: Secondary | ICD-10-CM | POA: Diagnosis not present

## 2023-01-05 DIAGNOSIS — I480 Paroxysmal atrial fibrillation: Secondary | ICD-10-CM | POA: Diagnosis not present

## 2023-01-19 DIAGNOSIS — M1811 Unilateral primary osteoarthritis of first carpometacarpal joint, right hand: Secondary | ICD-10-CM | POA: Diagnosis not present

## 2023-03-06 DIAGNOSIS — G5602 Carpal tunnel syndrome, left upper limb: Secondary | ICD-10-CM | POA: Diagnosis not present

## 2023-03-06 DIAGNOSIS — M1812 Unilateral primary osteoarthritis of first carpometacarpal joint, left hand: Secondary | ICD-10-CM | POA: Diagnosis not present

## 2023-03-13 DIAGNOSIS — H401111 Primary open-angle glaucoma, right eye, mild stage: Secondary | ICD-10-CM | POA: Diagnosis not present

## 2023-03-13 DIAGNOSIS — H401122 Primary open-angle glaucoma, left eye, moderate stage: Secondary | ICD-10-CM | POA: Diagnosis not present

## 2023-03-13 DIAGNOSIS — Z961 Presence of intraocular lens: Secondary | ICD-10-CM | POA: Diagnosis not present

## 2023-06-09 ENCOUNTER — Other Ambulatory Visit: Payer: Self-pay | Admitting: Internal Medicine

## 2023-06-09 DIAGNOSIS — Z1231 Encounter for screening mammogram for malignant neoplasm of breast: Secondary | ICD-10-CM

## 2023-06-25 DIAGNOSIS — E782 Mixed hyperlipidemia: Secondary | ICD-10-CM | POA: Diagnosis not present

## 2023-06-25 DIAGNOSIS — M818 Other osteoporosis without current pathological fracture: Secondary | ICD-10-CM | POA: Diagnosis not present

## 2023-06-25 DIAGNOSIS — R739 Hyperglycemia, unspecified: Secondary | ICD-10-CM | POA: Diagnosis not present

## 2023-07-02 DIAGNOSIS — E039 Hypothyroidism, unspecified: Secondary | ICD-10-CM | POA: Diagnosis not present

## 2023-07-02 DIAGNOSIS — E782 Mixed hyperlipidemia: Secondary | ICD-10-CM | POA: Diagnosis not present

## 2023-07-02 DIAGNOSIS — M5116 Intervertebral disc disorders with radiculopathy, lumbar region: Secondary | ICD-10-CM | POA: Diagnosis not present

## 2023-07-02 DIAGNOSIS — Z0000 Encounter for general adult medical examination without abnormal findings: Secondary | ICD-10-CM | POA: Diagnosis not present

## 2023-07-02 DIAGNOSIS — I480 Paroxysmal atrial fibrillation: Secondary | ICD-10-CM | POA: Diagnosis not present

## 2023-07-02 DIAGNOSIS — D500 Iron deficiency anemia secondary to blood loss (chronic): Secondary | ICD-10-CM | POA: Diagnosis not present

## 2023-07-13 ENCOUNTER — Ambulatory Visit
Admission: RE | Admit: 2023-07-13 | Discharge: 2023-07-13 | Disposition: A | Discharge status: Home / Self Care | Source: Ambulatory Visit | Attending: Internal Medicine | Admitting: Internal Medicine

## 2023-07-13 DIAGNOSIS — Z1231 Encounter for screening mammogram for malignant neoplasm of breast: Secondary | ICD-10-CM | POA: Diagnosis not present

## 2023-08-19 DIAGNOSIS — H401122 Primary open-angle glaucoma, left eye, moderate stage: Secondary | ICD-10-CM | POA: Diagnosis not present

## 2023-08-19 DIAGNOSIS — H401111 Primary open-angle glaucoma, right eye, mild stage: Secondary | ICD-10-CM | POA: Diagnosis not present

## 2023-08-27 DIAGNOSIS — H401111 Primary open-angle glaucoma, right eye, mild stage: Secondary | ICD-10-CM | POA: Diagnosis not present

## 2023-08-27 DIAGNOSIS — H43813 Vitreous degeneration, bilateral: Secondary | ICD-10-CM | POA: Diagnosis not present

## 2023-08-27 DIAGNOSIS — Z961 Presence of intraocular lens: Secondary | ICD-10-CM | POA: Diagnosis not present

## 2023-08-27 DIAGNOSIS — H401122 Primary open-angle glaucoma, left eye, moderate stage: Secondary | ICD-10-CM | POA: Diagnosis not present

## 2023-09-24 DIAGNOSIS — H401122 Primary open-angle glaucoma, left eye, moderate stage: Secondary | ICD-10-CM | POA: Diagnosis not present

## 2023-09-29 DIAGNOSIS — A084 Viral intestinal infection, unspecified: Secondary | ICD-10-CM | POA: Diagnosis not present

## 2023-10-15 DIAGNOSIS — H43821 Vitreomacular adhesion, right eye: Secondary | ICD-10-CM | POA: Diagnosis not present

## 2023-10-15 DIAGNOSIS — H401122 Primary open-angle glaucoma, left eye, moderate stage: Secondary | ICD-10-CM | POA: Diagnosis not present

## 2023-10-15 DIAGNOSIS — Z961 Presence of intraocular lens: Secondary | ICD-10-CM | POA: Diagnosis not present

## 2023-10-27 DIAGNOSIS — H903 Sensorineural hearing loss, bilateral: Secondary | ICD-10-CM | POA: Diagnosis not present

## 2023-11-11 IMAGING — MG MM DIGITAL SCREENING BILAT W/ TOMO AND CAD
8 series · 9 of 24 positions shown · non-contrast
Comparison: Previous exam(s).

CLINICAL DATA: Screening.

EXAM:
DIGITAL SCREENING BILATERAL MAMMOGRAM WITH TOMOSYNTHESIS AND CAD
TECHNIQUE: Bilateral screening digital craniocaudal and mediolateral oblique
mammograms were obtained. Bilateral screening digital breast
tomosynthesis was performed. The images were evaluated with
computer-aided detection.

[L MLO synth-2D]
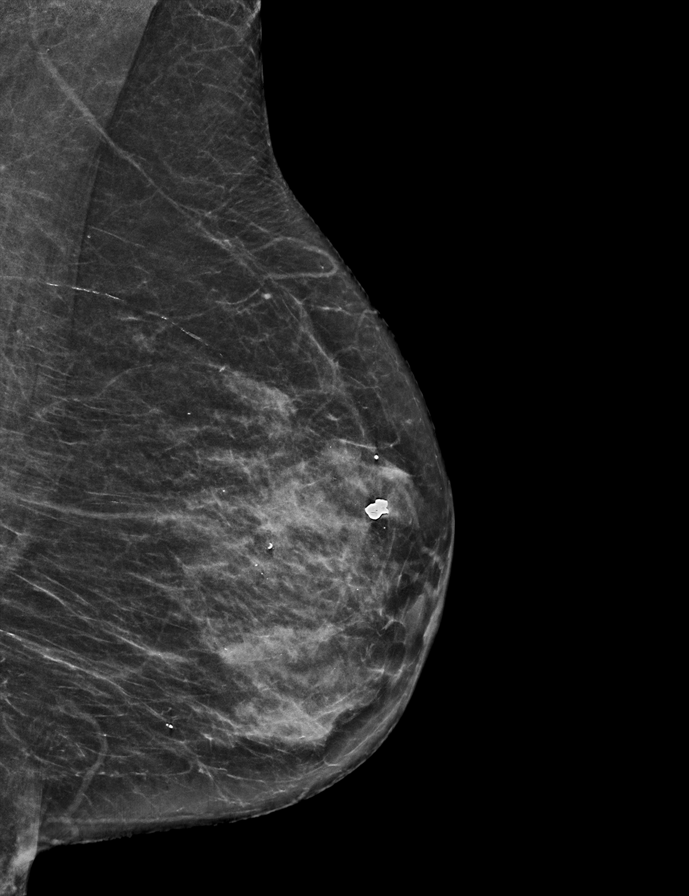

[R CC synth-2D]
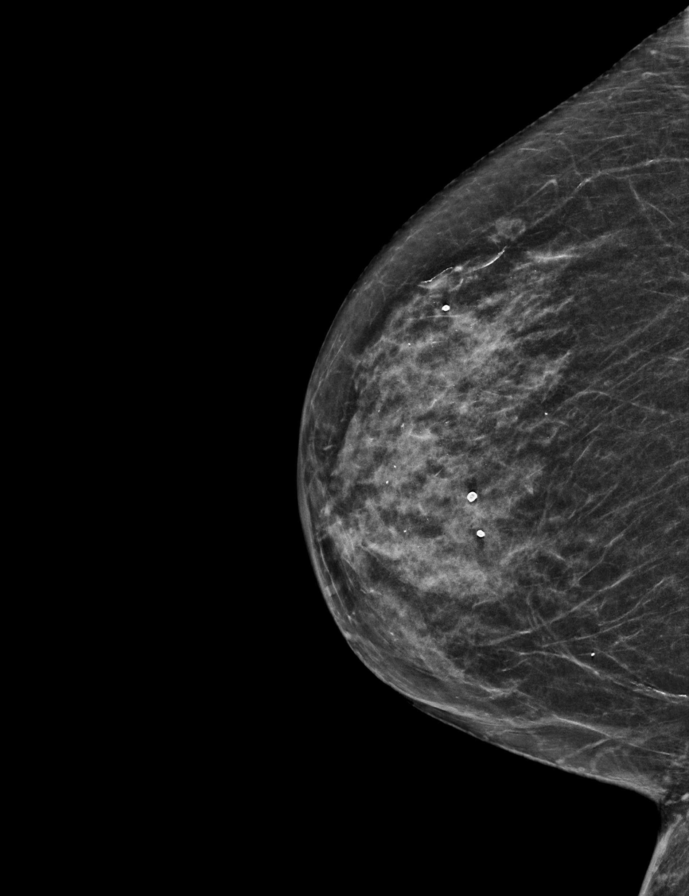

[L CC synth-2D]
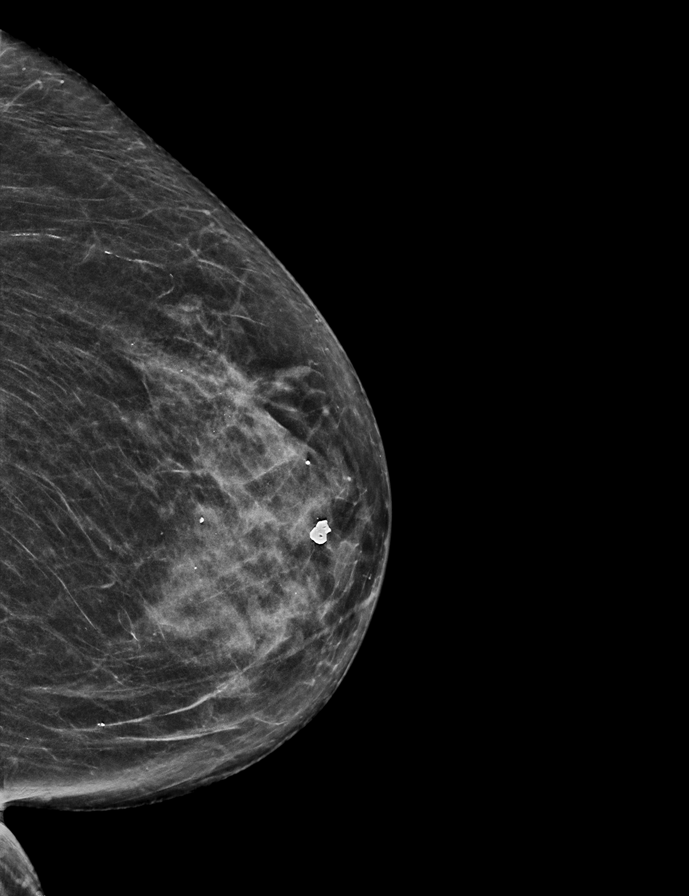

[R MLO synth-2D]
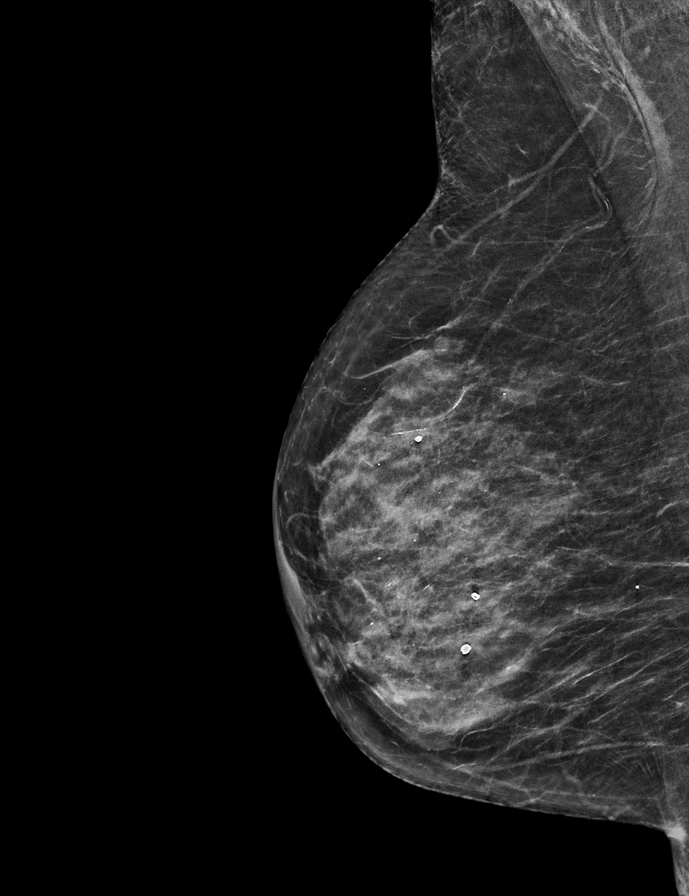

[L CC tomo · 2 of 53 frames shown]
[frame 18/53]
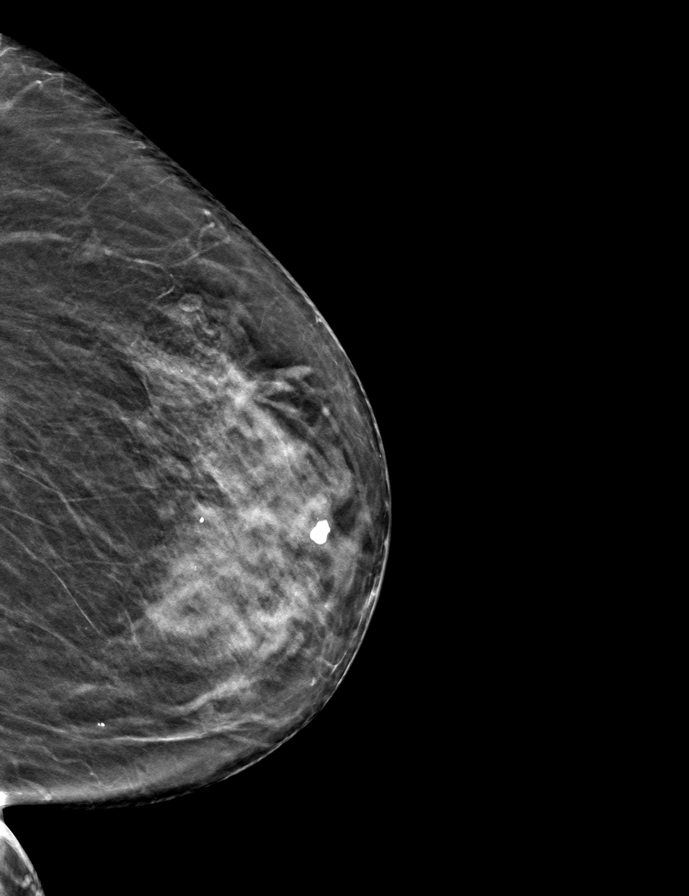
[frame 27/53]
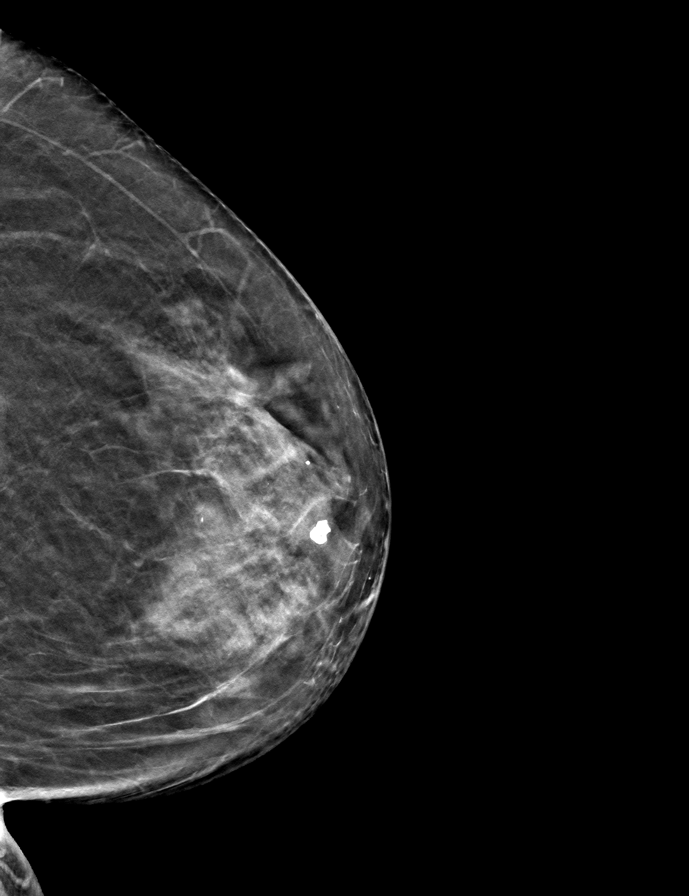

[R CC tomo · tomo slice 25/50.0]
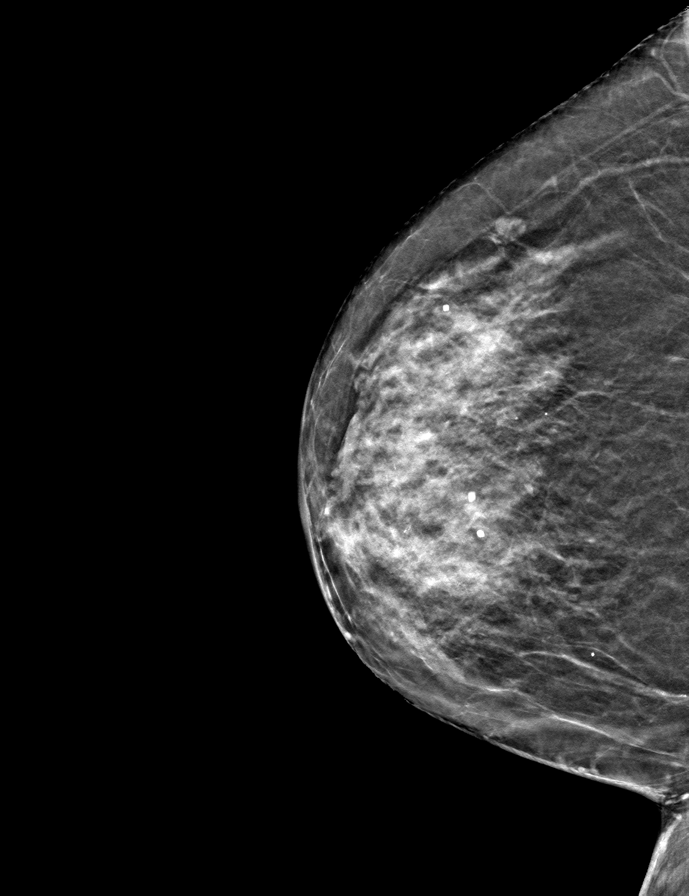

[R MLO tomo · tomo slice 25/48.0]
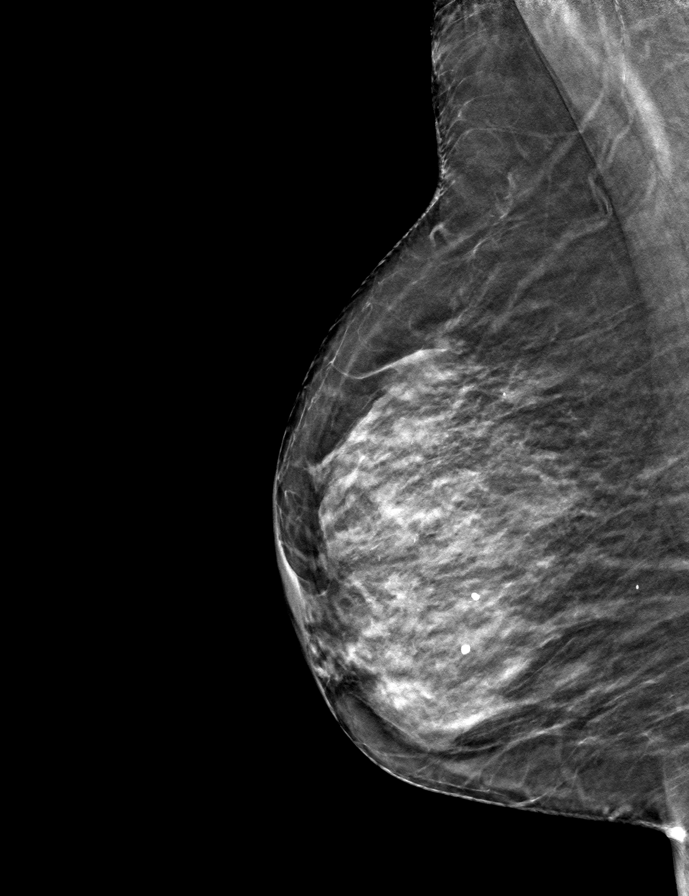

[L MLO tomo · tomo slice 27/53.0]
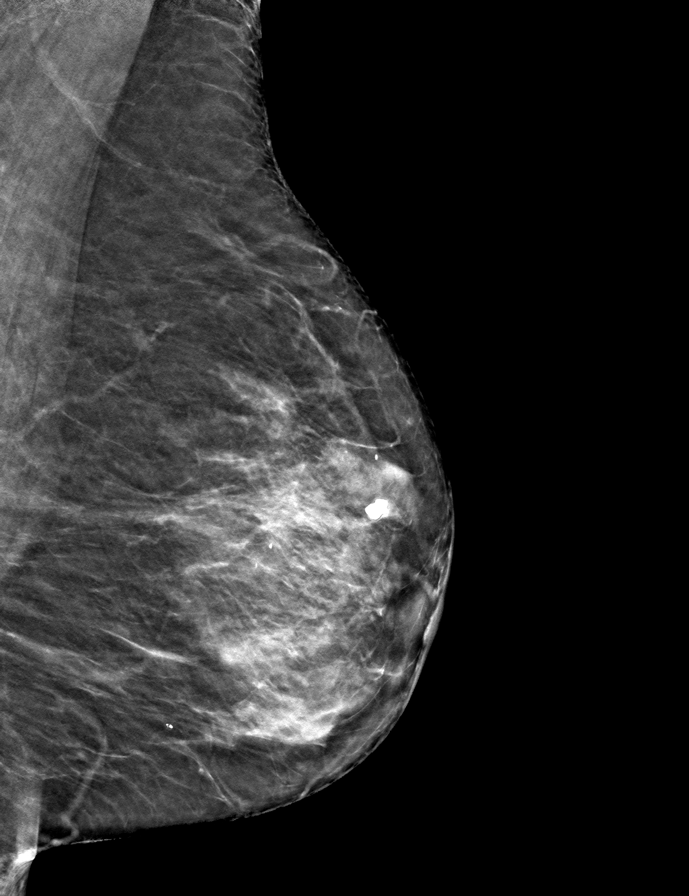

[9 of 24 positions shown; findings below may reference images not displayed]

ACR Breast Density Category c: The breast tissue is heterogeneously
dense, which may obscure small masses.
FINDINGS: There are no findings suspicious for malignancy.
IMPRESSION: No mammographic evidence of malignancy. A result letter of this
screening mammogram will be mailed directly to the patient.

RECOMMENDATION:
Screening mammogram in one year. (Code:Q3-W-BC3)

BI-RADS CATEGORY  1: Negative.

## 2023-12-29 DIAGNOSIS — E782 Mixed hyperlipidemia: Secondary | ICD-10-CM | POA: Diagnosis not present

## 2024-01-05 DIAGNOSIS — D500 Iron deficiency anemia secondary to blood loss (chronic): Secondary | ICD-10-CM | POA: Diagnosis not present

## 2024-01-05 DIAGNOSIS — E782 Mixed hyperlipidemia: Secondary | ICD-10-CM | POA: Diagnosis not present

## 2024-01-05 DIAGNOSIS — I480 Paroxysmal atrial fibrillation: Secondary | ICD-10-CM | POA: Diagnosis not present

## 2024-01-05 DIAGNOSIS — R739 Hyperglycemia, unspecified: Secondary | ICD-10-CM | POA: Diagnosis not present
